# Patient Record
Sex: Female | Born: 1960
Health system: Southern US, Community
[De-identification: ages and names within clinical notes are randomized; demographics above are authoritative.]

## PROBLEM LIST (undated history)

## (undated) DIAGNOSIS — Z9889 Other specified postprocedural states: Secondary | ICD-10-CM

## (undated) DIAGNOSIS — R112 Nausea with vomiting, unspecified: Secondary | ICD-10-CM

## (undated) DIAGNOSIS — T8859XA Other complications of anesthesia, initial encounter: Secondary | ICD-10-CM

## (undated) DIAGNOSIS — I10 Essential (primary) hypertension: Secondary | ICD-10-CM

## (undated) DIAGNOSIS — T4145XA Adverse effect of unspecified anesthetic, initial encounter: Secondary | ICD-10-CM

## (undated) DIAGNOSIS — T7840XA Allergy, unspecified, initial encounter: Secondary | ICD-10-CM

## (undated) HISTORY — DX: Allergy, unspecified, initial encounter: T78.40XA

## (undated) HISTORY — DX: Essential (primary) hypertension: I10

---
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
## 1983-01-03 HISTORY — PX: BACK SURGERY: SHX140

## 2002-10-08 ENCOUNTER — Encounter: Payer: Self-pay | Admitting: Obstetrics and Gynecology

## 2002-10-08 ENCOUNTER — Ambulatory Visit (HOSPITAL_COMMUNITY): Admission: RE | Admit: 2002-10-08 | Discharge: 2002-10-08 | Payer: Self-pay | Admitting: Obstetrics and Gynecology

## 2002-10-13 ENCOUNTER — Encounter: Payer: Self-pay | Admitting: Obstetrics and Gynecology

## 2002-10-13 ENCOUNTER — Ambulatory Visit (HOSPITAL_COMMUNITY): Admission: RE | Admit: 2002-10-13 | Discharge: 2002-10-13 | Payer: Self-pay | Admitting: Obstetrics and Gynecology

## 2006-07-16 ENCOUNTER — Ambulatory Visit (HOSPITAL_COMMUNITY): Admission: RE | Admit: 2006-07-16 | Discharge: 2006-07-16 | Payer: Self-pay | Admitting: Family Medicine

## 2006-10-01 ENCOUNTER — Ambulatory Visit (HOSPITAL_COMMUNITY): Admission: RE | Admit: 2006-10-01 | Discharge: 2006-10-01 | Payer: Self-pay | Admitting: Obstetrics & Gynecology

## 2007-10-02 ENCOUNTER — Ambulatory Visit (HOSPITAL_COMMUNITY): Admission: RE | Admit: 2007-10-02 | Discharge: 2007-10-02 | Payer: Self-pay | Admitting: Obstetrics & Gynecology

## 2008-10-07 ENCOUNTER — Ambulatory Visit (HOSPITAL_COMMUNITY): Admission: RE | Admit: 2008-10-07 | Discharge: 2008-10-07 | Payer: Self-pay | Admitting: Obstetrics & Gynecology

## 2009-10-25 ENCOUNTER — Ambulatory Visit (HOSPITAL_COMMUNITY)
Admission: RE | Admit: 2009-10-25 | Discharge: 2009-10-25 | Payer: Self-pay | Source: Home / Self Care | Admitting: Obstetrics & Gynecology

## 2010-03-03 ENCOUNTER — Ambulatory Visit (HOSPITAL_COMMUNITY)
Admission: RE | Admit: 2010-03-03 | Discharge: 2010-03-03 | Disposition: A | Discharge status: Home / Self Care | Payer: BC Managed Care – PPO | Source: Ambulatory Visit | Attending: Internal Medicine | Admitting: Internal Medicine

## 2010-03-03 ENCOUNTER — Other Ambulatory Visit (HOSPITAL_COMMUNITY): Payer: Self-pay | Admitting: Internal Medicine

## 2010-03-03 DIAGNOSIS — R05 Cough: Secondary | ICD-10-CM

## 2010-03-03 DIAGNOSIS — R059 Cough, unspecified: Secondary | ICD-10-CM | POA: Insufficient documentation

## 2011-04-10 ENCOUNTER — Other Ambulatory Visit (HOSPITAL_COMMUNITY): Payer: Self-pay | Admitting: Family Medicine

## 2011-04-10 DIAGNOSIS — Z139 Encounter for screening, unspecified: Secondary | ICD-10-CM

## 2011-04-17 ENCOUNTER — Ambulatory Visit (HOSPITAL_COMMUNITY)
Admission: RE | Admit: 2011-04-17 | Discharge: 2011-04-17 | Disposition: A | Discharge status: Home / Self Care | Payer: BC Managed Care – PPO | Source: Ambulatory Visit | Attending: Family Medicine | Admitting: Family Medicine

## 2011-04-17 DIAGNOSIS — Z1231 Encounter for screening mammogram for malignant neoplasm of breast: Secondary | ICD-10-CM | POA: Insufficient documentation

## 2011-04-17 DIAGNOSIS — Z139 Encounter for screening, unspecified: Secondary | ICD-10-CM

## 2011-05-16 ENCOUNTER — Encounter: Payer: Self-pay | Admitting: Internal Medicine

## 2011-07-03 ENCOUNTER — Encounter: Payer: Self-pay | Admitting: Internal Medicine

## 2011-07-03 ENCOUNTER — Ambulatory Visit (AMBULATORY_SURGERY_CENTER): Payer: BC Managed Care – PPO | Admitting: *Deleted

## 2011-07-03 VITALS — Ht 64.0 in | Wt 212.0 lb

## 2011-07-03 DIAGNOSIS — Z1211 Encounter for screening for malignant neoplasm of colon: Secondary | ICD-10-CM

## 2011-07-03 MED ORDER — MOVIPREP 100 G PO SOLR: ORAL | Status: DC

## 2011-07-12 ENCOUNTER — Ambulatory Visit (AMBULATORY_SURGERY_CENTER): Payer: BC Managed Care – PPO | Admitting: Internal Medicine

## 2011-07-12 ENCOUNTER — Encounter: Payer: Self-pay | Admitting: Internal Medicine

## 2011-07-12 VITALS — BP 112/65 | HR 82 | Temp 98.1°F | Resp 20 | Ht 64.0 in | Wt 212.0 lb

## 2011-07-12 DIAGNOSIS — Z1211 Encounter for screening for malignant neoplasm of colon: Secondary | ICD-10-CM

## 2011-07-12 MED ORDER — SODIUM CHLORIDE 0.9 % IV SOLN: 500.0000 mL | INTRAVENOUS | Status: DC

## 2011-07-12 NOTE — Patient Instructions (Signed)
YOU HAD AN ENDOSCOPIC PROCEDURE TODAY AT THE [facility]: Refer to the procedure report that was given to you for any specific questions about what was found during the examination.  If the procedure report does not answer your questions, please call your gastroenterologist to clarify.  If you requested that your care partner not be given the details of your procedure findings, then the procedure report has been included in a sealed envelope for you to review at your convenience later.  YOU SHOULD EXPECT: Some feelings of bloating in the abdomen. Passage of more gas than usual.  Walking can help get rid of the air that was put into your GI tract during the procedure and reduce the bloating. If you had a lower endoscopy (such as a colonoscopy or flexible sigmoidoscopy) you may notice spotting of blood in your stool or on the toilet paper. If you underwent a bowel prep for your procedure, then you may not have a normal bowel movement for a few days.  DIET: Your first meal following the procedure should be a light meal and then it is ok to progress to your normal diet.  A half-sandwich or bowl of soup is an example of a good first meal.  Heavy or fried foods are harder to digest and may make you feel nauseous or bloated.  Likewise meals heavy in dairy and vegetables can cause extra gas to form and this can also increase the bloating.  Drink plenty of fluids but you should avoid alcoholic beverages for 24 hours.  ACTIVITY: Your care partner should take you home directly after the procedure.  You should plan to take it easy, moving slowly for the rest of the day.  You can resume normal activity the day after the procedure however you should NOT DRIVE or use heavy machinery for 24 hours (because of the sedation medicines used during the test).    SYMPTOMS TO REPORT IMMEDIATELY: A gastroenterologist can be reached at any hour.  During normal business hours, 8:30 AM to 5:00 PM Monday through Friday,  call [phone].  After hours and on weekends, please call the GI answering service at [phone] who will take a message and have the physician on call contact you.   Following lower endoscopy (colonoscopy or flexible sigmoidoscopy):  Excessive amounts of blood in the stool  Significant tenderness or worsening of abdominal pains  Swelling of the abdomen that is new, acute  Fever of 100F or higher    FOLLOW UP: If any biopsies were taken you will be contacted by phone or by letter within the next 1-3 weeks.  Call your gastroenterologist if you have not heard about the biopsies in 3 weeks.  Our staff will call the home number listed on your records the next business day following your procedure to check on you and address any questions or concerns that you may have at that time regarding the information given to you following your procedure. This is a courtesy call and so if there is no answer at the home number and we have not heard from you through the emergency physician on call, we will assume that you have returned to your regular daily activities without incident.  SIGNATURES/CONFIDENTIALITY: You and/or your care partner have signed paperwork which will be entered into your electronic medical record.  These signatures attest to the fact that that the information above on your After Visit Summary has been reviewed and is understood.  Full responsibility of the confidentiality   of this discharge information lies with you and/or your care-partner.     Your colonoscopy was normal today . Repeat colonoscopy in 10 years.

## 2011-07-12 NOTE — Progress Notes (Signed)
Patient did not have preoperative order for IV antibiotic SSI prophylaxis. (G8918)  Patient did not experience any of the following events: a burn prior to discharge; a fall within the facility; wrong site/side/patient/procedure/implant event; or a hospital transfer or hospital admission upon discharge from the facility. (G8907)  

## 2011-07-12 NOTE — Op Note (Signed)
[facility] [address] [place], [place]  [address]  COLONOSCOPY PROCEDURE REPORT  PATIENT:  Tracy Castro, Tracy Castro  MR#:  [id number] BIRTHDATE:  [birth year], 50 yrs. old  GENDER:  female ENDOSCOPIST:  Hedwig Morton. Juanda Chance, MD REF. BY:  Genia Del, M.D. Assunta Found, M.D. PROCEDURE DATE:  07/12/2011 PROCEDURE:  Colonoscopy 19147 ASA CLASS:  Class I INDICATIONS:  colorectal cancer screening, average risk MEDICATIONS:   MAC sedation, administered by CRNA, propofol (Diprivan) 250 mg  DESCRIPTION OF PROCEDURE:   After the risks and benefits and of the procedure were explained, informed consent was obtained. Digital rectal exam was performed and revealed no rectal masses. The LB [id number] endoscope was introduced through the anus and advanced to the cecum, which was identified by both the appendix and ileocecal valve.  The quality of the prep was good, using MoviPrep.  The instrument was then slowly withdrawn as the colon was fully examined. <<PROCEDUREIMAGES>>  FINDINGS:  No polyps or cancers were seen (see image1, image2, image3, image4, and image5).   Retroflexed views in the rectum revealed no abnormalities.    The scope was then withdrawn from the patient and the procedure completed.  COMPLICATIONS:  None ENDOSCOPIC IMPRESSION: 1) No polyps or cancers 2) Normal colonoscopy RECOMMENDATIONS: 1) High fiber diet.  REPEAT EXAM:  In 10 year(s) for.  ______________________________ Hedwig Morton. Juanda Chance, MD  CC:  n. eSIGNED:   Hedwig Morton. Jamerion Cabello at 07/12/2011 01:46 PM  Louie Bun, [phone]

## 2011-07-13 ENCOUNTER — Telehealth: Payer: Self-pay | Admitting: *Deleted

## 2011-07-13 NOTE — Telephone Encounter (Signed)
  Follow up Call-  Call back number 07/12/2011  Post procedure Call Back phone  # [phone]  Permission to leave phone message Yes     Patient questions:  Message left to call if necessary.

## 2012-10-23 ENCOUNTER — Other Ambulatory Visit (HOSPITAL_COMMUNITY): Payer: Self-pay | Admitting: Family Medicine

## 2012-10-23 DIAGNOSIS — Z139 Encounter for screening, unspecified: Secondary | ICD-10-CM

## 2012-11-12 ENCOUNTER — Ambulatory Visit (HOSPITAL_COMMUNITY)
Admission: RE | Admit: 2012-11-12 | Discharge: 2012-11-12 | Disposition: A | Discharge status: Home / Self Care | Payer: BC Managed Care – PPO | Source: Ambulatory Visit | Attending: Family Medicine | Admitting: Family Medicine

## 2012-11-12 DIAGNOSIS — Z139 Encounter for screening, unspecified: Secondary | ICD-10-CM

## 2012-11-12 DIAGNOSIS — Z1231 Encounter for screening mammogram for malignant neoplasm of breast: Secondary | ICD-10-CM | POA: Insufficient documentation

## 2013-02-19 ENCOUNTER — Other Ambulatory Visit (HOSPITAL_COMMUNITY): Payer: Self-pay | Admitting: Family Medicine

## 2013-02-19 ENCOUNTER — Ambulatory Visit (HOSPITAL_COMMUNITY)
Admission: RE | Admit: 2013-02-19 | Discharge: 2013-02-19 | Disposition: A | Discharge status: Home / Self Care | Payer: BC Managed Care – PPO | Source: Ambulatory Visit | Attending: Family Medicine | Admitting: Family Medicine

## 2013-02-19 DIAGNOSIS — M25519 Pain in unspecified shoulder: Secondary | ICD-10-CM

## 2013-04-25 ENCOUNTER — Emergency Department (HOSPITAL_COMMUNITY)
Admission: EM | Admit: 2013-04-25 | Discharge: 2013-04-25 | Disposition: A | Discharge status: Home / Self Care | Payer: BC Managed Care – PPO | Attending: Emergency Medicine | Admitting: Emergency Medicine

## 2013-04-25 ENCOUNTER — Encounter (HOSPITAL_COMMUNITY): Payer: Self-pay | Admitting: Emergency Medicine

## 2013-04-25 ENCOUNTER — Emergency Department (HOSPITAL_COMMUNITY): Payer: BC Managed Care – PPO

## 2013-04-25 DIAGNOSIS — I10 Essential (primary) hypertension: Secondary | ICD-10-CM | POA: Insufficient documentation

## 2013-04-25 DIAGNOSIS — X58XXXA Exposure to other specified factors, initial encounter: Secondary | ICD-10-CM

## 2013-04-25 DIAGNOSIS — M549 Dorsalgia, unspecified: Secondary | ICD-10-CM | POA: Insufficient documentation

## 2013-04-25 DIAGNOSIS — Z79899 Other long term (current) drug therapy: Secondary | ICD-10-CM | POA: Insufficient documentation

## 2013-04-25 DIAGNOSIS — Z9889 Other specified postprocedural states: Secondary | ICD-10-CM | POA: Insufficient documentation

## 2013-04-25 DIAGNOSIS — M84369A Stress fracture, unspecified tibia and fibula, initial encounter for fracture: Secondary | ICD-10-CM | POA: Insufficient documentation

## 2013-04-25 DIAGNOSIS — Y929 Unspecified place or not applicable: Secondary | ICD-10-CM | POA: Insufficient documentation

## 2013-04-25 DIAGNOSIS — Y9389 Activity, other specified: Secondary | ICD-10-CM | POA: Insufficient documentation

## 2013-04-25 DIAGNOSIS — IMO0002 Reserved for concepts with insufficient information to code with codable children: Secondary | ICD-10-CM | POA: Insufficient documentation

## 2013-04-25 DIAGNOSIS — S86899A Other injury of other muscle(s) and tendon(s) at lower leg level, unspecified leg, initial encounter: Secondary | ICD-10-CM

## 2013-04-25 DIAGNOSIS — X503XXA Overexertion from repetitive movements, initial encounter: Secondary | ICD-10-CM | POA: Insufficient documentation

## 2013-04-25 MED ORDER — KETOROLAC TROMETHAMINE 10 MG PO TABS
10.0000 mg | ORAL_TABLET | Freq: Once | ORAL | Status: AC
  Administered 2013-04-25: 10 mg via ORAL
  Filled 2013-04-25: qty 1

## 2013-04-25 MED ORDER — HYDROCODONE-ACETAMINOPHEN 5-325 MG PO TABS
2.0000 | ORAL_TABLET | Freq: Once | ORAL | Status: AC
  Administered 2013-04-25: 2 via ORAL
  Filled 2013-04-25: qty 2

## 2013-04-25 MED ORDER — HYDROCODONE-ACETAMINOPHEN 5-325 MG PO TABS: 1.0000 | ORAL_TABLET | ORAL | Status: DC | PRN

## 2013-04-25 NOTE — ED Notes (Addendum)
R shin and ankle pain began last week. States pain runs down front of shin from below knee to anterior ankle.  She had been running, cycling and walking.  Seen at urgent care Wednesday. Film done of R tibia/fibula but not ankle.  Given anti inflammatory which has not helped.  Has also been icing it without relief.  Wrapping it gave a little relief.

## 2013-04-25 NOTE — Discharge Instructions (Signed)
Please rest and elevate your right lower extremity. Please apply ice for 15-20 minute intervals. Please continue your anti-inflammatory medication. May use Norco at bedtime or every 4 hours if needed for more severe pain. Please refrain from strenuous activity over the next week, and then gradually work her way back into your routine. Please see the orthopedic specialist listed above, or the orthopedic specialist of your choice if not improving. Shin Splints Shin splints is a painful condition that is felt on the shinbone or in the muscles on either side of the bone (front of your lower leg). Shin splints happen when physical activities, such as sports or other demanding exercise, leads to inflammation of the muscles, tendons, and the thin layer that covers the shinbone.  CAUSES   Overuse of muscles.  Repetitive activities.  Flat feet or rigid arches. Activities that could contribute to shin splints include:  A sudden increase in exercise time.  Starting a new, demanding activity.  Running up hills or long distances.  Playing sports with sudden starts and stops.  A poor warm up.  Old or worn-out shoes. SYMPTOMS   Pain on the front of the leg.  Pain while exercising or at rest. DIAGNOSIS  Your caregiver will diagnose shin splints from a history of your symptoms and a physical exam. You may be observed as you walk or run. X-ray exams or further testing may be needed to rule out other problems, such as a stress fracture, which also causes lower leg pain. TREATMENT  Your caregiver may decide on the treatment based on your age, history, health, and how bad the pain is. Most cases of shin splints can be managed by one or more of the following:  Resting.  Reducing the length and intensity of your exercise.  Stopping the activity that causes shin pain.  Taking medicines to control the inflammation.  Icing, massaging, stretching, and strengthening the affected area.  Getting shoes  with rigid heels, shock absorption, and a good arch support. HOME CARE INSTRUCTIONS   Resume activity steadily or as directed by your caregiver.  Restart your exercise sessions with non-weight-bearing exercises, such as cycling or swimming.  Stop running if the pain returns.  Warm up properly before exercising.  Run on a level and fairly firm surface.  Gradually change the intensity of an exercise.  Limit increases in running distance by no more than 5 to 10% weekly. This means if you are running 5 miles, you can only increase your run by 1/2 a mile at a time.  Change your athletic shoes every 6 months, or every 350 to 450 miles. SEEK MEDICAL CARE IF:   Symptoms continue or worsen even after treatment.  The location, intensity, or type of pain changes over time. SEEK IMMEDIATE MEDICAL CARE IF:   You have severe pain.  You have trouble walking. MAKE SURE YOU:  Understand these instructions.  Will watch your condition.  Will get help right away if you are not doing well or get worse. Document Released: 12/17/1999 Document Revised: 03/13/2011 Document Reviewed: 06/05/2010 [facility] Patient Information 2014 [place], [place].

## 2013-04-25 NOTE — ED Provider Notes (Signed)
Medical screening examination/treatment/procedure(s) were performed by non-physician practitioner and as supervising physician I was immediately available for consultation/collaboration.   EKG Interpretation None      Devoria AlbeIva Warner Laduca, MD, Armando GangFACEP   Ward GivensIva L Berlene Dixson, MD 04/25/13 2329

## 2013-04-25 NOTE — ED Provider Notes (Signed)
CSN: [id number]     Arrival date & time 04/25/13  2046 History   First MD Initiated Contact with Patient 04/25/13 2157     Chief Complaint  Patient presents with  . Ankle Pain     (Consider location/radiation/quality/duration/timing/severity/associated sxs/prior Treatment) HPI Comments: Patient is a 53 year old female who presents to the emergency department with a complaint of pain of her right lower leg. The patient states that she has been doing a lot of bicycling, walking, and exercising more than she ever has in the past. Today she was walking across the parking lot, when she had a severe pain in her anterior portion of her lower leg, and it has not gone away. She was seen at one of the urgent cares had x-rays of her tibial area and it was negative. The doctors and radiologist thought that she should have an x-ray of her ankle to the major there was no occult injury with referred pain, the patient presents now for evaluation of her ankle as well as her tibial area pain. She's not had any previous operations or procedures involving the tibia.  Patient is a 53 y.o. female presenting with ankle pain. The history is provided by the patient.  Ankle Pain Associated symptoms: back pain   Associated symptoms: no neck pain     Past Medical History  Diagnosis Date  . Hypertension   . Allergy     seasonal   Past Surgical History  Procedure Laterality Date  . Cesarean section  1995  . Back surgery  1985    lower lumbar   History reviewed. No pertinent family history. History  Substance Use Topics  . Smoking status: Never Smoker   . Smokeless tobacco: Never Used  . Alcohol Use: No   OB History   Grav Para Term Preterm Abortions TAB SAB Ect Mult Living                 Review of Systems  Constitutional: Negative for activity change.       All ROS Neg except as noted in HPI  HENT: Negative for nosebleeds.   Eyes: Negative for photophobia and discharge.  Respiratory: Negative for  cough, shortness of breath and wheezing.   Cardiovascular: Negative for chest pain and palpitations.  Gastrointestinal: Negative for abdominal pain and blood in stool.  Genitourinary: Negative for dysuria, frequency and hematuria.  Musculoskeletal: Positive for back pain. Negative for arthralgias and neck pain.  Skin: Negative.   Neurological: Negative for dizziness, seizures and speech difficulty.  Psychiatric/Behavioral: Negative for hallucinations and confusion.      Allergies  Review of patient's allergies indicates no known allergies.  Home Medications   Prior to Admission medications   Medication Sig Start Date End Date Taking? Authorizing Provider  HYDROcodone-acetaminophen (NORCO/VICODIN) 5-325 MG per tablet Take 1 tablet by mouth every 4 (four) hours as needed for moderate pain. 04/25/13   Kathie DikeHobson M Lala Been, PA-C  lisinopril-hydrochlorothiazide (PRINZIDE,ZESTORETIC) 20-12.5 MG per tablet Take 1 tablet by mouth Daily. 06/15/11   Historical Provider, MD   BP 131/72  Pulse 93  Temp(Src) 97.9 F (36.6 C) (Oral)  Resp 22  Ht 5\' 4"  (1.626 m)  Wt 195 lb (88.451 kg)  BMI 33.46 kg/m2  SpO2 98% Physical Exam  Nursing note and vitals reviewed. Constitutional: She is oriented to person, place, and time. She appears well-developed and well-nourished.  Non-toxic appearance.  HENT:  Head: Normocephalic.  Right Ear: Tympanic membrane and external ear normal.  Left Ear:  Tympanic membrane and external ear normal.  Eyes: EOM and lids are normal. Pupils are equal, round, and reactive to light.  Neck: Normal range of motion. Neck supple. Carotid bruit is not present.  Cardiovascular: Normal rate, regular rhythm, normal heart sounds, intact distal pulses and normal pulses.   Pulmonary/Chest: Breath sounds normal. No respiratory distress.  Abdominal: Soft. Bowel sounds are normal. There is no tenderness. There is no guarding.  Musculoskeletal: Normal range of motion.       Legs: There is  slight increased redness of the lower tibial area. There is no hot areas appreciated. There is a negative Homans sign. Dorsalis pedis and posterior tibial pulses are 2+.   Lymphadenopathy:       Head (right side): No submandibular adenopathy present.       Head (left side): No submandibular adenopathy present.    She has no cervical adenopathy.  Neurological: She is alert and oriented to person, place, and time. She has normal strength. No cranial nerve deficit or sensory deficit.  Skin: Skin is warm and dry.  Psychiatric: She has a normal mood and affect. Her speech is normal.    ED Course  Procedures (including critical care time) Labs Review Labs Reviewed - No data to display  Imaging Review Dg Ankle Complete Right  04/25/2013   CLINICAL DATA:  Right ankle pain.  EXAM: RIGHT ANKLE - COMPLETE 3+ VIEW  COMPARISON:  None.  FINDINGS: Imaged bones, joints and soft tissues appear normal.  IMPRESSION: Negative exam.   Electronically Signed   By: Drusilla Kannerhomas  Dalessio M.D.   On: 04/25/2013 21:18     EKG Interpretation None      MDM X-ray of the right ankle is negative for fracture or dislocation or effusion of the joint. The examination is consistent with tibial stress syndrome, or shin splints. The plan at this time is for the patient to rest her legs over the next 48-72 hours. To continue to use her prescribed anti-inflammatory medication. Will add Norco at bedtime, or every 4 hours if needed for pain. The patient is to see Dr. Hilda LiasKeeling for additional evaluation if not improving.    Final diagnoses:  Medial tibial stress syndrome    **I have reviewed nursing notes, vital signs, and all appropriate lab and imaging results for this patient.Kathie Dike*    Alazar Cherian M Fabrizzio Marcella, PA-C 04/25/13 2321

## 2015-08-27 ENCOUNTER — Emergency Department (HOSPITAL_COMMUNITY)
Admission: EM | Admit: 2015-08-27 | Discharge: 2015-08-27 | Disposition: A | Discharge status: Home / Self Care | Payer: Worker's Compensation | Attending: Emergency Medicine | Admitting: Emergency Medicine

## 2015-08-27 ENCOUNTER — Encounter (HOSPITAL_COMMUNITY): Payer: Self-pay

## 2015-08-27 ENCOUNTER — Emergency Department (HOSPITAL_COMMUNITY): Payer: Worker's Compensation

## 2015-08-27 DIAGNOSIS — Z79899 Other long term (current) drug therapy: Secondary | ICD-10-CM | POA: Insufficient documentation

## 2015-08-27 DIAGNOSIS — I10 Essential (primary) hypertension: Secondary | ICD-10-CM | POA: Diagnosis not present

## 2015-08-27 DIAGNOSIS — Y929 Unspecified place or not applicable: Secondary | ICD-10-CM | POA: Diagnosis not present

## 2015-08-27 DIAGNOSIS — Y999 Unspecified external cause status: Secondary | ICD-10-CM | POA: Insufficient documentation

## 2015-08-27 DIAGNOSIS — X501XXA Overexertion from prolonged static or awkward postures, initial encounter: Secondary | ICD-10-CM | POA: Diagnosis not present

## 2015-08-27 DIAGNOSIS — Y939 Activity, unspecified: Secondary | ICD-10-CM | POA: Insufficient documentation

## 2015-08-27 DIAGNOSIS — S99912A Unspecified injury of left ankle, initial encounter: Secondary | ICD-10-CM | POA: Diagnosis present

## 2015-08-27 DIAGNOSIS — S93402A Sprain of unspecified ligament of left ankle, initial encounter: Secondary | ICD-10-CM | POA: Diagnosis not present

## 2015-08-27 MED ORDER — IBUPROFEN 800 MG PO TABS
800.0000 mg | ORAL_TABLET | Freq: Once | ORAL | Status: AC
  Administered 2015-08-27: 800 mg via ORAL
  Filled 2015-08-27: qty 1

## 2015-08-27 NOTE — ED Provider Notes (Signed)
TIME SEEN: 1:20 AM  CHIEF COMPLAINT: Left ankle and foot pain  HPI: Pt is a 55 y.o. female with history of hypertension who reports that she twisted her left ankle tonight just prior to arrival. Did not fall to the ground or hit her head. Has had throbbing pain since and has pain with weightbearing. Took Tylenol prior to arrival with some improvement of pain. No other injury on exam. No numbness or focal weakness.  ROS: See HPI Constitutional: no fever  Eyes: no drainage  ENT: no runny nose   Cardiovascular:  no chest pain  Resp: no SOB  GI: no vomiting GU: no dysuria Integumentary: no rash  Allergy: no hives  Musculoskeletal: no leg swelling  Neurological: no slurred speech ROS otherwise negative  PAST MEDICAL HISTORY/PAST SURGICAL HISTORY:  Past Medical History:  Diagnosis Date  . Allergy    seasonal  . Hypertension     MEDICATIONS:  Prior to Admission medications   Medication Sig Start Date End Date Taking? Authorizing Provider  HYDROcodone-acetaminophen (NORCO/VICODIN) 5-325 MG per tablet Take 1 tablet by mouth every 4 (four) hours as needed for moderate pain. 04/25/13   Ivery Quale, PA-C  lisinopril-hydrochlorothiazide (PRINZIDE,ZESTORETIC) 20-12.5 MG per tablet Take 1 tablet by mouth Daily. 06/15/11   Historical Provider, MD    ALLERGIES:  No Known Allergies  SOCIAL HISTORY:  Social History  Substance Use Topics  . Smoking status: Never Smoker  . Smokeless tobacco: Never Used  . Alcohol use No    FAMILY HISTORY: No family history on file.  EXAM: BP 135/90 (BP Location: Left Arm)   Pulse 86   Temp 97.9 F (36.6 C) (Oral)   Resp 18   Ht 5\' 4"  (1.626 m)   Wt 197 lb (89.4 kg)   SpO2 96%   BMI 33.81 kg/m  CONSTITUTIONAL: Alert and oriented and responds appropriately to questions. Well-appearing; well-nourished HEAD: Normocephalic EYES: Conjunctivae clear, PERRL ENT: normal nose; no rhinorrhea; moist mucous membranes NECK: Supple, no meningismus, no LAD   CARD: RRR; S1 and S2 appreciated; no murmurs, no clicks, no rubs, no gallops RESP: Normal chest excursion without splinting or tachypnea; breath sounds clear and equal bilaterally; no wheezes, no rhonchi, no rales, no hypoxia or respiratory distress, speaking full sentences ABD/GI: Normal bowel sounds; non-distended; soft, non-tender, no rebound, no guarding, no peritoneal signs BACK:  The back appears normal and is non-tender to palpation, there is no CVA tenderness EXT: Tender to palpation of the left ankle and the sole of the left foot with some ecchymosis to the dorsal foot without bony deformity. 2+ DP pulses bilaterally. No ligamentous laxity on exam. No tenderness over the proximal fibular head. Normal ROM in all joints; otherwise extremity is are non-tender to palpation; no edema; normal capillary refill; no cyanosis, no calf tenderness or swelling    SKIN: Normal color for age and race; warm; no rash NEURO: Moves all extremities equally, sensation to light touch intact diffusely, cranial nerves II through XII intact PSYCH: The patient's mood and manner are appropriate. Grooming and personal hygiene are appropriate.  MEDICAL DECISION MAKING: Patient here after she twisted her left ankle. Complaining of foot and ankle pain. X-rays show no fracture or dislocation. We'll place her in an Ace wrap for comfort and provided crutches to use as needed for comfort. We'll give her Dr. Mort Sawyers follow-up information if symptoms do not improve next 1-2 weeks despite rest, elevation, ice, anti-inflammatories. Patient reports she feels comfortable alternating Tylenol and Motrin at  home and does not want anything stronger for pain. Discussed with her return precautions. Patient and husband at bedside are comfortable with this plan.   At this time, I do not feel there is any life-threatening condition present. I have reviewed and discussed all results (EKG, imaging, lab, urine as appropriate), exam findings  with patient/family. I have reviewed nursing notes and appropriate previous records.  I feel the patient is safe to be discharged home without further emergent workup and can continue workup as an outpatient as needed. Discussed usual and customary return precautions. Patient/family verbalize understanding and are comfortable with this plan.  Outpatient follow-up has been provided. All questions have been answered.        Layla MawKristen N Ward, DO 08/27/15 [phone]

## 2015-08-27 NOTE — Discharge Instructions (Signed)
You may alternate between Tylenol 1000 mg every 6 hours as needed for pain and ibuprofen 800 mg every 8 hours as needed for pain. °

## 2015-08-27 NOTE — ED Triage Notes (Signed)
Pt states she rolled her ankle while walking at approx 1930 last night.

## 2017-01-09 DIAGNOSIS — M25551 Pain in right hip: Secondary | ICD-10-CM | POA: Diagnosis not present

## 2017-07-10 DIAGNOSIS — M1612 Unilateral primary osteoarthritis, left hip: Secondary | ICD-10-CM | POA: Diagnosis not present

## 2017-07-12 DIAGNOSIS — M1612 Unilateral primary osteoarthritis, left hip: Secondary | ICD-10-CM | POA: Diagnosis not present

## 2017-10-01 DIAGNOSIS — M1612 Unilateral primary osteoarthritis, left hip: Secondary | ICD-10-CM | POA: Diagnosis not present

## 2017-10-09 DIAGNOSIS — M545 Low back pain: Secondary | ICD-10-CM | POA: Diagnosis not present

## 2017-10-09 DIAGNOSIS — M1612 Unilateral primary osteoarthritis, left hip: Secondary | ICD-10-CM | POA: Diagnosis not present

## 2017-10-09 DIAGNOSIS — M47896 Other spondylosis, lumbar region: Secondary | ICD-10-CM | POA: Diagnosis not present

## 2017-10-15 DIAGNOSIS — M47896 Other spondylosis, lumbar region: Secondary | ICD-10-CM | POA: Diagnosis not present

## 2017-10-15 DIAGNOSIS — M1612 Unilateral primary osteoarthritis, left hip: Secondary | ICD-10-CM | POA: Diagnosis not present

## 2017-10-15 DIAGNOSIS — M545 Low back pain: Secondary | ICD-10-CM | POA: Diagnosis not present

## 2017-10-17 DIAGNOSIS — M545 Low back pain: Secondary | ICD-10-CM | POA: Diagnosis not present

## 2017-10-17 DIAGNOSIS — M47896 Other spondylosis, lumbar region: Secondary | ICD-10-CM | POA: Diagnosis not present

## 2017-10-17 DIAGNOSIS — M1612 Unilateral primary osteoarthritis, left hip: Secondary | ICD-10-CM | POA: Diagnosis not present

## 2017-10-22 DIAGNOSIS — M47896 Other spondylosis, lumbar region: Secondary | ICD-10-CM | POA: Diagnosis not present

## 2017-10-22 DIAGNOSIS — M1612 Unilateral primary osteoarthritis, left hip: Secondary | ICD-10-CM | POA: Diagnosis not present

## 2017-10-22 DIAGNOSIS — M545 Low back pain: Secondary | ICD-10-CM | POA: Diagnosis not present

## 2017-10-24 DIAGNOSIS — M1612 Unilateral primary osteoarthritis, left hip: Secondary | ICD-10-CM | POA: Diagnosis not present

## 2017-10-24 DIAGNOSIS — M545 Low back pain: Secondary | ICD-10-CM | POA: Diagnosis not present

## 2017-10-24 DIAGNOSIS — M47896 Other spondylosis, lumbar region: Secondary | ICD-10-CM | POA: Diagnosis not present

## 2017-10-29 DIAGNOSIS — M545 Low back pain: Secondary | ICD-10-CM | POA: Diagnosis not present

## 2017-10-29 DIAGNOSIS — M47896 Other spondylosis, lumbar region: Secondary | ICD-10-CM | POA: Diagnosis not present

## 2017-10-29 DIAGNOSIS — M1612 Unilateral primary osteoarthritis, left hip: Secondary | ICD-10-CM | POA: Diagnosis not present

## 2017-11-01 DIAGNOSIS — M545 Low back pain: Secondary | ICD-10-CM | POA: Diagnosis not present

## 2017-11-01 DIAGNOSIS — M1612 Unilateral primary osteoarthritis, left hip: Secondary | ICD-10-CM | POA: Diagnosis not present

## 2017-11-01 DIAGNOSIS — M47896 Other spondylosis, lumbar region: Secondary | ICD-10-CM | POA: Diagnosis not present

## 2017-11-06 DIAGNOSIS — M1612 Unilateral primary osteoarthritis, left hip: Secondary | ICD-10-CM | POA: Diagnosis not present

## 2017-11-06 DIAGNOSIS — M47896 Other spondylosis, lumbar region: Secondary | ICD-10-CM | POA: Diagnosis not present

## 2017-11-06 DIAGNOSIS — M545 Low back pain: Secondary | ICD-10-CM | POA: Diagnosis not present

## 2017-11-08 DIAGNOSIS — M47896 Other spondylosis, lumbar region: Secondary | ICD-10-CM | POA: Diagnosis not present

## 2017-11-08 DIAGNOSIS — M1612 Unilateral primary osteoarthritis, left hip: Secondary | ICD-10-CM | POA: Diagnosis not present

## 2017-11-08 DIAGNOSIS — M545 Low back pain: Secondary | ICD-10-CM | POA: Diagnosis not present

## 2017-11-14 DIAGNOSIS — M545 Low back pain: Secondary | ICD-10-CM | POA: Diagnosis not present

## 2017-11-14 DIAGNOSIS — M47896 Other spondylosis, lumbar region: Secondary | ICD-10-CM | POA: Diagnosis not present

## 2017-11-14 DIAGNOSIS — M1612 Unilateral primary osteoarthritis, left hip: Secondary | ICD-10-CM | POA: Diagnosis not present

## 2017-11-15 DIAGNOSIS — M545 Low back pain: Secondary | ICD-10-CM | POA: Diagnosis not present

## 2017-11-15 DIAGNOSIS — M1612 Unilateral primary osteoarthritis, left hip: Secondary | ICD-10-CM | POA: Diagnosis not present

## 2017-11-15 DIAGNOSIS — M47896 Other spondylosis, lumbar region: Secondary | ICD-10-CM | POA: Diagnosis not present

## 2017-11-20 DIAGNOSIS — M545 Low back pain: Secondary | ICD-10-CM | POA: Diagnosis not present

## 2017-11-20 DIAGNOSIS — M47896 Other spondylosis, lumbar region: Secondary | ICD-10-CM | POA: Diagnosis not present

## 2017-11-20 DIAGNOSIS — M1612 Unilateral primary osteoarthritis, left hip: Secondary | ICD-10-CM | POA: Diagnosis not present

## 2017-11-21 DIAGNOSIS — M1612 Unilateral primary osteoarthritis, left hip: Secondary | ICD-10-CM | POA: Diagnosis not present

## 2017-11-21 DIAGNOSIS — M545 Low back pain: Secondary | ICD-10-CM | POA: Diagnosis not present

## 2017-11-21 DIAGNOSIS — M47896 Other spondylosis, lumbar region: Secondary | ICD-10-CM | POA: Diagnosis not present

## 2017-11-26 DIAGNOSIS — M47896 Other spondylosis, lumbar region: Secondary | ICD-10-CM | POA: Diagnosis not present

## 2017-11-26 DIAGNOSIS — M1612 Unilateral primary osteoarthritis, left hip: Secondary | ICD-10-CM | POA: Diagnosis not present

## 2017-11-26 DIAGNOSIS — M545 Low back pain: Secondary | ICD-10-CM | POA: Diagnosis not present

## 2017-11-27 DIAGNOSIS — M1612 Unilateral primary osteoarthritis, left hip: Secondary | ICD-10-CM | POA: Diagnosis not present

## 2017-11-27 DIAGNOSIS — M47896 Other spondylosis, lumbar region: Secondary | ICD-10-CM | POA: Diagnosis not present

## 2017-11-27 DIAGNOSIS — M545 Low back pain: Secondary | ICD-10-CM | POA: Diagnosis not present

## 2017-12-03 DIAGNOSIS — M545 Low back pain: Secondary | ICD-10-CM | POA: Diagnosis not present

## 2017-12-03 DIAGNOSIS — M47896 Other spondylosis, lumbar region: Secondary | ICD-10-CM | POA: Diagnosis not present

## 2017-12-03 DIAGNOSIS — M1612 Unilateral primary osteoarthritis, left hip: Secondary | ICD-10-CM | POA: Diagnosis not present

## 2017-12-04 DIAGNOSIS — M1612 Unilateral primary osteoarthritis, left hip: Secondary | ICD-10-CM | POA: Diagnosis not present

## 2017-12-04 DIAGNOSIS — M47896 Other spondylosis, lumbar region: Secondary | ICD-10-CM | POA: Diagnosis not present

## 2017-12-04 DIAGNOSIS — M545 Low back pain: Secondary | ICD-10-CM | POA: Diagnosis not present

## 2017-12-10 DIAGNOSIS — M1612 Unilateral primary osteoarthritis, left hip: Secondary | ICD-10-CM | POA: Diagnosis not present

## 2017-12-10 DIAGNOSIS — M545 Low back pain: Secondary | ICD-10-CM | POA: Diagnosis not present

## 2017-12-10 DIAGNOSIS — M47896 Other spondylosis, lumbar region: Secondary | ICD-10-CM | POA: Diagnosis not present

## 2017-12-18 DIAGNOSIS — M47896 Other spondylosis, lumbar region: Secondary | ICD-10-CM | POA: Diagnosis not present

## 2017-12-18 DIAGNOSIS — M1612 Unilateral primary osteoarthritis, left hip: Secondary | ICD-10-CM | POA: Diagnosis not present

## 2017-12-18 DIAGNOSIS — M545 Low back pain: Secondary | ICD-10-CM | POA: Diagnosis not present

## 2018-02-21 DIAGNOSIS — R490 Dysphonia: Secondary | ICD-10-CM | POA: Diagnosis not present

## 2018-02-21 DIAGNOSIS — Z6835 Body mass index (BMI) 35.0-35.9, adult: Secondary | ICD-10-CM | POA: Diagnosis not present

## 2018-02-21 DIAGNOSIS — E669 Obesity, unspecified: Secondary | ICD-10-CM | POA: Diagnosis not present

## 2018-04-08 DIAGNOSIS — M1612 Unilateral primary osteoarthritis, left hip: Secondary | ICD-10-CM | POA: Diagnosis not present

## 2018-05-15 DIAGNOSIS — M1612 Unilateral primary osteoarthritis, left hip: Secondary | ICD-10-CM | POA: Diagnosis not present

## 2018-05-21 DIAGNOSIS — M1612 Unilateral primary osteoarthritis, left hip: Secondary | ICD-10-CM | POA: Diagnosis present

## 2018-05-21 DIAGNOSIS — I10 Essential (primary) hypertension: Secondary | ICD-10-CM | POA: Diagnosis present

## 2018-05-21 NOTE — H&P (Addendum)
HIP ARTHROPLASTY ADMISSION H&P  Patient ID: Tracy Castro MRN: [id number] DOB/AGE: 58/04/1960 58 y.o.  Chief Complaint: left hip pain.  Planned Procedure Date: 06/04/2018 Medical and cardiac clearance by Dr. Phillips OdorGolding   HPI: Tracy Castro is a 58 y.o. female who presents for evaluation of OA LEFT HIP. The patient has a history of pain and functional disability in the left hip due to arthritis and has failed non-surgical conservative treatments for greater than 12 weeks to include NSAID's and/or analgesics, corticosteriod injections and activity modification.  Onset of symptoms was gradual, starting 2 years ago with gradually worsening course since that time.  Patient currently rates pain at 8 out of 10 with activity. Patient has night pain, worsening of pain with activity and weight bearing and pain that interferes with activities of daily living.  Patient has evidence of subchondral cysts, subchondral sclerosis, periarticular osteophytes and joint space narrowing by imaging studies.  There is no active infection.  Past Medical History:  Diagnosis Date  . Allergy    seasonal  . Hypertension    Past Surgical History:  Procedure Laterality Date  . BACK SURGERY  1985   lower lumbar  . CESAREAN SECTION  1995   No Known Allergies   Prior to Admission medications   Medication Sig Start Date End Date Taking? Authorizing Provider  cetirizine (ZYRTEC) 10 MG tablet Take 10 mg by mouth daily.   Yes [provider]   Social history: Non smoker.  No alcohol use.  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  Family history: Father w/ HTN.  Grandparents w/ MI, HTN.  ROS: Currently denies lightheadedness, dizziness, Fever, chills, CP, SOB.   No personal history of DVT, PE, MI, or CVA. No loose teeth or dentures. All other systems have been reviewed and were otherwise currently negative with the exception of those mentioned in the HPI and as above.  Objective: Vitals: Ht: 5'4"  Wt: 205 Temp: 97.9 BP: 136/89  Pulse: 92 O2 97% on room air.   Physical Exam: General: Alert, NAD. Trendelenberg Gait  HEENT: EOMI, Good Neck Extension  Pulm: No increased work of breathing.  Clear B/L A/P w/o crackle or wheeze.  CV: RRR, No m/g/r appreciated  GI: soft, NT, ND Neuro: Neuro without gross focal deficit.  Sensation intact distally Skin: No lesions in the area of chief complaint MSK/Surgical Site: Left hip pain with passive ROM.  Positive Stinchfield.  5/5 strength.  NVI.  Sensation intact distally.  Imaging Review Plain radiographs demonstrate severe degenerative joint disease of the left hip.   Preoperative templating of the joint replacement has been completed, documented, and submitted to the Operating Room personnel in order to optimize intra-operative equipment management.  Assessment: OA LEFT HIP Principal Problem:   Primary osteoarthritis of left hip Active Problems:   Hypertension   Plan: Plan for Procedure(s): TOTAL HIP ARTHROPLASTY ANTERIOR APPROACH  The patient history, physical exam, clinical judgement of the provider and imaging are consistent with end stage degenerative joint disease and total joint arthroplasty is deemed medically necessary. The treatment options including medical management, injection therapy, and arthroplasty were discussed at length. The risks and benefits of Procedure(s): TOTAL HIP ARTHROPLASTY ANTERIOR APPROACH were presented and reviewed.  The risks of nonoperative treatment, versus surgical intervention including but not limited to continued pain, aseptic loosening, stiffness, dislocation/subluxation, infection, bleeding, nerve injury, blood clots, cardiopulmonary complications, morbidity, mortality, among others were discussed. The patient verbalizes understanding and wishes to proceed with the plan.  Patient is being admitted for  inpatient treatment for surgery, pain control, PT, OT, prophylactic antibiotics, VTE prophylaxis, progressive ambulation, ADL's and  discharge planning.   Dental prophylaxis discussed and recommended for 2 years postoperatively.   The patient does meet the criteria for TXA which will be used perioperatively.    ASA 81 mg BID will be used postoperatively for DVT prophylaxis in addition to SCDs, and early ambulation.  Plan for Celebrex, Gabapentin, Norco for pain.  Robaxin for spasm.  Omeprazole for gastric protection.  The patient is planning to be discharged home with outpatient physical therapy in care of her husband.   Patient's anticipated LOS is less than 2 midnights, meeting these requirements: - Younger than 10 - Lives within 1 hour of care - Has a competent adult at home to recover with post-op recover - NO history of  - Chronic pain requiring opiods  - Diabetes  - Coronary Artery Disease  - Heart failure  - Heart attack  - Stroke  - DVT/VTE  - Cardiac arrhythmia  - Respiratory Failure/COPD  - Renal failure  - Anemia  - Advanced Liver disease  Albina Billet III, PA-C 05/21/2018 3:25 PM

## 2018-05-22 NOTE — Progress Notes (Signed)
SPOKE W/Pt _     SCREENING SYMPTOMS OF COVID 19:   COUGH--No  RUNNY NOSE--- No  SORE THROAT---No  NASAL CONGESTION----No  SNEEZING----No  SHORTNESS OF BREATH---No  DIFFICULTY BREATHING---No  TEMP >100.0 -----No  UNEXPLAINED BODY ACHES------No  CHILLS --------No   HEADACHES ---------No  LOSS OF SMELL/ TASTE --------No    HAVE YOU OR ANY FAMILY MEMBER TRAVELLED PAST 14 DAYS OUT OF THE   COUNTY---No STATE----No COUNTRY---No-  HAVE YOU OR ANY FAMILY MEMBER BEEN EXPOSED TO ANYONE WITH COVID 19? No    

## 2018-05-22 NOTE — Patient Instructions (Signed)
REIZEL CALZADA  05/22/2018   Your procedure is scheduled on: 06-04-18     Report to [facility] Main  Entrance    Report to Admitting at 10:00 AM   YOU NEED TO HAVE A COVID 19 TEST ON 05-31-18, THIS TEST MUST BE DONE BEFORE SURGERY, COME TO [facility] ENTRANCE BETWEEN THE HOURS OF 900 AM AND 300 PM ON YOUR COVID TEST DATE.   Call this number if you have problems the morning of surgery [phone]    Remember: NO SOLID FOOD AFTER MIDNIGHT THE NIGHT PRIOR TO SURGERY. NOTHING BY MOUTH EXCEPT CLEAR LIQUIDS UNTIL 3 HOURS PRIOR TO SCHEDULED SURGERY. PLEASE FINISH ENSURE DRINK PER SURGEON ORDER 3 HOURS PRIOR TO SCHEDULED SURGERY TIME WHICH NEEDS TO BE COMPLETED AT  4:30 AM.   CLEAR LIQUID DIET   Foods Allowed                                                                     Foods Excluded  Coffee and tea, regular and decaf                             liquids that you cannot  Plain Jell-O in any flavor                                             see through such as: Fruit ices (not with fruit pulp)                                     milk, soups, orange juice  Iced Popsicles                                    All solid food Carbonated beverages, regular and diet                                    Cranberry, grape and apple juices Sports drinks like Gatorade Lightly seasoned clear broth or consume(fat free) Sugar, honey syrup  Sample Menu Breakfast                                Lunch                                     Supper Cranberry juice                    Beef broth                            Chicken broth Jell-O  Grape juice                           Apple juice Coffee or tea                        Jell-O                                      Popsicle                                                Coffee or tea                        Coffee or  tea  _____________________________________________________________________              BRUSH YOUR TEETH MORNING OF SURGERY AND RINSE YOUR MOUTH OUT, NO CHEWING GUM CANDY OR MINTS.     Take these medicines the morning of surgery with A SIP OF WATER:Cetirizine (Zyrtec)                                 You may not have any metal on your body including hair pins and              piercings     Do not wear jewelry, make-up, lotions, powders or perfumes, deodorant               Do not wear nail polish.  Do not shave  48 hours prior to surgery.              Do not bring valuables to the hospital. [facility] IS NOT             RESPONSIBLE   FOR VALUABLES.  Contacts, dentures or bridgework may not be worn into surgery.  Leave suitcase in the car. After surgery it may be brought to your room.    Special Instructions: N/A              Please read over the following fact sheets you were given: _____________________________________________________________________             [facility] Pre[facility] Before surgery, you can play an important role.  Because skin is not sterile, your skin needs to be as free of germs as possible.  You can reduce the number of germs on your skin by washing with CHG (chlorahexidine gluconate) soap before surgery.  CHG is an antiseptic cleaner which kills germs and bonds with the skin to continue killing germs even after washing. Please DO NOT use if you have an allergy to CHG or antibacterial soaps.  If your skin becomes reddened/irritated stop using the CHG and inform your nurse when you arrive at Short Stay. Do not shave (including legs and underarms) for at least 48 hours prior to the first CHG shower.  You may shave your face/neck. Please follow these instructions carefully:  1.  Shower with CHG Soap the night before surgery and the  morning of Surgery.  2.  If you choose to wash your hair, wash your hair first as usual  with your  normal   shampoo.  3.  After you shampoo, rinse your hair and body thoroughly to remove the  shampoo.                           4.  Use CHG as you would any other liquid soap.  You can apply chg directly  to the skin and wash                       Gently with a scrungie or clean washcloth.  5.  Apply the CHG Soap to your body ONLY FROM THE NECK DOWN.   Do not use on face/ open                           Wound or open sores. Avoid contact with eyes, ears mouth and genitals (private parts).                       Wash face,  Genitals (private parts) with your normal soap.             6.  Wash thoroughly, paying special attention to the area where your surgery  will be performed.  7.  Thoroughly rinse your body with warm water from the neck down.  8.  DO NOT shower/wash with your normal soap after using and rinsing off  the CHG Soap.                9.  Pat yourself dry with a clean towel.            10.  Wear clean pajamas.            11.  Place clean sheets on your bed the night of your first shower and do not  sleep with pets. Day of Surgery : Do not apply any lotions/deodorants the morning of surgery.  Please wear clean clothes to the hospital/surgery center.  FAILURE TO FOLLOW THESE INSTRUCTIONS MAY RESULT IN THE CANCELLATION OF YOUR SURGERY PATIENT SIGNATURE_________________________________  NURSE SIGNATURE__________________________________  ________________________________________________________________________   Adam Phenix  An incentive spirometer is a tool that can help keep your lungs clear and active. This tool measures how well you are filling your lungs with each breath. Taking long deep breaths may help reverse or decrease the chance of developing breathing (pulmonary) problems (especially infection) following:  A long period of time when you are unable to move or be active. BEFORE THE PROCEDURE   If the spirometer includes an indicator to show your best effort, your nurse or  respiratory therapist will set it to a desired goal.  If possible, sit up straight or lean slightly forward. Try not to slouch.  Hold the incentive spirometer in an upright position. INSTRUCTIONS FOR USE  1. Sit on the edge of your bed if possible, or sit up as far as you can in bed or on a chair. 2. Hold the incentive spirometer in an upright position. 3. Breathe out normally. 4. Place the mouthpiece in your mouth and seal your lips tightly around it. 5. Breathe in slowly and as deeply as possible, raising the piston or the ball toward the top of the column. 6. Hold your breath for 3-5 seconds or for as long as possible. Allow the piston or ball to fall to the bottom of the column. 7. Remove the mouthpiece from  your mouth and breathe out normally. 8. Rest for a few seconds and repeat Steps 1 through 7 at least 10 times every 1-2 hours when you are awake. Take your time and take a few normal breaths between deep breaths. 9. The spirometer may include an indicator to show your best effort. Use the indicator as a goal to work toward during each repetition. 10. After each set of 10 deep breaths, practice coughing to be sure your lungs are clear. If you have an incision (the cut made at the time of surgery), support your incision when coughing by placing a pillow or rolled up towels firmly against it. Once you are able to get out of bed, walk around indoors and cough well. You may stop using the incentive spirometer when instructed by your caregiver.  RISKS AND COMPLICATIONS  Take your time so you do not get dizzy or light-headed.  If you are in pain, you may need to take or ask for pain medication before doing incentive spirometry. It is harder to take a deep breath if you are having pain. AFTER USE  Rest and breathe slowly and easily.  It can be helpful to keep track of a log of your progress. Your caregiver can provide you with a simple table to help with this. If you are using the  spirometer at home, follow these instructions: SEEK MEDICAL CARE IF:   You are having difficultly using the spirometer.  You have trouble using the spirometer as often as instructed.  Your pain medication is not giving enough relief while using the spirometer.  You develop fever of 100.5 F (38.1 C) or higher. SEEK IMMEDIATE MEDICAL CARE IF:   You cough up bloody sputum that had not been present before.  You develop fever of 102 F (38.9 C) or greater.  You develop worsening pain at or near the incision site. MAKE SURE YOU:   Understand these instructions.  Will watch your condition.  Will get help right away if you are not doing well or get worse. Document Released: 05/01/2006 Document Revised: 03/13/2011 Document Reviewed: 07/02/2006 [facility] Patient Information 2014 [place], [place].   ________________________________________________________________________

## 2018-05-24 ENCOUNTER — Encounter (HOSPITAL_COMMUNITY): Payer: Self-pay

## 2018-05-24 ENCOUNTER — Other Ambulatory Visit: Payer: Self-pay

## 2018-05-24 ENCOUNTER — Encounter (HOSPITAL_COMMUNITY)
Admission: RE | Admit: 2018-05-24 | Discharge: 2018-05-24 | Disposition: A | Discharge status: Home / Self Care | Payer: BC Managed Care – PPO | Source: Ambulatory Visit | Attending: Orthopedic Surgery | Admitting: Orthopedic Surgery

## 2018-05-24 DIAGNOSIS — Z01818 Encounter for other preprocedural examination: Secondary | ICD-10-CM | POA: Diagnosis present

## 2018-05-24 DIAGNOSIS — I10 Essential (primary) hypertension: Secondary | ICD-10-CM | POA: Insufficient documentation

## 2018-05-24 DIAGNOSIS — M1612 Unilateral primary osteoarthritis, left hip: Secondary | ICD-10-CM | POA: Insufficient documentation

## 2018-05-24 DIAGNOSIS — R9431 Abnormal electrocardiogram [ECG] [EKG]: Secondary | ICD-10-CM | POA: Insufficient documentation

## 2018-05-24 HISTORY — DX: Other specified postprocedural states: Z98.890

## 2018-05-24 HISTORY — DX: Other complications of anesthesia, initial encounter: T88.59XA

## 2018-05-24 HISTORY — DX: Other specified postprocedural states: R11.2

## 2018-05-24 LAB — BASIC METABOLIC PANEL
Anion gap: 8 (ref 5–15)
BUN: 17 mg/dL (ref 6–20)
CO2: 27 mmol/L (ref 22–32)
Calcium: 9.3 mg/dL (ref 8.9–10.3)
Chloride: 107 mmol/L (ref 98–111)
Creatinine, Ser: 0.67 mg/dL (ref 0.44–1.00)
GFR calc Af Amer: >60 mL/min (ref >60)
GFR calc non Af Amer: >60 mL/min (ref >60)
Glucose, Bld: 118 mg/dL — ABNORMAL HIGH (ref 70–99)
Potassium: 4.2 mmol/L (ref 3.5–5.1)
Sodium: 142 mmol/L (ref 135–145)

## 2018-05-24 LAB — URINALYSIS, ROUTINE W REFLEX MICROSCOPIC
Bilirubin Urine: NEGATIVE
Glucose, UA: NEGATIVE mg/dL
Ketones, ur: NEGATIVE mg/dL
Nitrite: NEGATIVE
Protein, ur: NEGATIVE mg/dL
Specific Gravity, Urine: 1.021 (ref 1.005–1.030)
WBC, UA: >50 WBC/hpf — ABNORMAL HIGH (ref 0–5)
pH: 6 (ref 5.0–8.0)

## 2018-05-24 LAB — CBC
HCT: 41.2 % (ref 36.0–46.0)
Hemoglobin: 13.7 g/dL (ref 12.0–15.0)
MCH: 31.8 pg (ref 26.0–34.0)
MCHC: 33.3 g/dL (ref 30.0–36.0)
MCV: 95.6 fL (ref 80.0–100.0)
Platelets: 367 10*3/uL (ref 150–400)
RBC: 4.31 MIL/uL (ref 3.87–5.11)
RDW: 12.3 % (ref 11.5–15.5)
WBC: 8.6 10*3/uL (ref 4.0–10.5)
nRBC: 0 % (ref 0.0–0.2)

## 2018-05-24 LAB — SURGICAL PCR SCREEN
MRSA, PCR: NEGATIVE
Staphylococcus aureus: NEGATIVE

## 2018-05-31 ENCOUNTER — Other Ambulatory Visit (HOSPITAL_COMMUNITY)
Admission: RE | Admit: 2018-05-31 | Discharge: 2018-05-31 | Disposition: A | Discharge status: Home / Self Care | Payer: BC Managed Care – PPO | Source: Ambulatory Visit | Attending: Orthopedic Surgery | Admitting: Orthopedic Surgery

## 2018-05-31 DIAGNOSIS — Z1159 Encounter for screening for other viral diseases: Secondary | ICD-10-CM | POA: Insufficient documentation

## 2018-06-01 LAB — NOVEL CORONAVIRUS, NAA (HOSP ORDER, SEND-OUT TO REF LAB; TAT 18-24 HRS): SARS-CoV-2, NAA: NOT DETECTED

## 2018-06-03 MED ORDER — BUPIVACAINE LIPOSOME 1.3 % IJ SUSP
10.0000 mL | Freq: Once | INTRAMUSCULAR | Status: DC
  Filled 2018-06-03 (×3): qty 10

## 2018-06-03 NOTE — Progress Notes (Signed)
Pt made aware that surgery time on 06-04-18 is now 10:00 AM. Pt to report to Admitting at 7:30 AM. Pt verbalized understanding.

## 2018-06-03 NOTE — Progress Notes (Signed)
SPOKE W/  Tracy Castro      SCREENING SYMPTOMS OF COVID 19:   COUGH--NO  RUNNY NOSE--- NO  SORE THROAT---NO  NASAL CONGESTION----NO  SNEEZING----NO  SHORTNESS OF BREATH---NO  DIFFICULTY BREATHING---NO  TEMP >100.0 -----NO  UNEXPLAINED BODY ACHES------NO  CHILLS --------NO  HEADACHES ---------NO  LOSS OF SMELL/ TASTE --------NO    HAVE YOU OR ANY FAMILY MEMBER TRAVELLED PAST 14 DAYS OUT OF THE   COUNTY---NO STATE----NO COUNTRY----NO  HAVE YOU OR ANY FAMILY MEMBER BEEN EXPOSED TO ANYONE WITH COVID 19? NO

## 2018-06-04 ENCOUNTER — Ambulatory Visit (HOSPITAL_COMMUNITY): Payer: BC Managed Care – PPO

## 2018-06-04 ENCOUNTER — Encounter (HOSPITAL_COMMUNITY): Payer: Self-pay | Admitting: *Deleted

## 2018-06-04 ENCOUNTER — Observation Stay (HOSPITAL_COMMUNITY)
Admission: RE | Admit: 2018-06-04 | Discharge: 2018-06-05 | Disposition: A | Discharge status: Home / Self Care | Payer: BC Managed Care – PPO | Attending: Orthopedic Surgery | Admitting: Orthopedic Surgery

## 2018-06-04 ENCOUNTER — Ambulatory Visit (HOSPITAL_COMMUNITY): Payer: BC Managed Care – PPO | Admitting: Physician Assistant

## 2018-06-04 ENCOUNTER — Other Ambulatory Visit: Payer: Self-pay

## 2018-06-04 ENCOUNTER — Ambulatory Visit (HOSPITAL_COMMUNITY): Payer: BC Managed Care – PPO | Admitting: Anesthesiology

## 2018-06-04 ENCOUNTER — Encounter (HOSPITAL_COMMUNITY)
Admission: RE | Disposition: A | Discharge status: Home / Self Care | Payer: Self-pay | Source: Home / Self Care | Attending: Orthopedic Surgery

## 2018-06-04 DIAGNOSIS — M1612 Unilateral primary osteoarthritis, left hip: Principal | ICD-10-CM | POA: Insufficient documentation

## 2018-06-04 DIAGNOSIS — Z79899 Other long term (current) drug therapy: Secondary | ICD-10-CM | POA: Diagnosis not present

## 2018-06-04 DIAGNOSIS — E669 Obesity, unspecified: Secondary | ICD-10-CM | POA: Insufficient documentation

## 2018-06-04 DIAGNOSIS — M1610 Unilateral primary osteoarthritis, unspecified hip: Secondary | ICD-10-CM | POA: Diagnosis present

## 2018-06-04 DIAGNOSIS — I10 Essential (primary) hypertension: Secondary | ICD-10-CM | POA: Insufficient documentation

## 2018-06-04 DIAGNOSIS — Z6834 Body mass index (BMI) 34.0-34.9, adult: Secondary | ICD-10-CM | POA: Insufficient documentation

## 2018-06-04 DIAGNOSIS — Z419 Encounter for procedure for purposes other than remedying health state, unspecified: Secondary | ICD-10-CM

## 2018-06-04 HISTORY — PX: TOTAL HIP ARTHROPLASTY: SHX124

## 2018-06-04 SURGERY — ARTHROPLASTY, HIP, TOTAL, ANTERIOR APPROACH
Anesthesia: Spinal | Site: Hip | Laterality: Left

## 2018-06-04 MED ORDER — OXYCODONE HCL 5 MG PO TABS: 5.0000 mg | ORAL_TABLET | Freq: Once | ORAL | Status: DC | PRN

## 2018-06-04 MED ORDER — CEFAZOLIN SODIUM-DEXTROSE 2-4 GM/100ML-% IV SOLN
INTRAVENOUS | Status: AC
  Filled 2018-06-04: qty 100

## 2018-06-04 MED ORDER — ACETAMINOPHEN 500 MG PO TABS
1000.0000 mg | ORAL_TABLET | Freq: Once | ORAL | Status: AC
  Administered 2018-06-04: 08:00:00 1000 mg via ORAL

## 2018-06-04 MED ORDER — CEFAZOLIN SODIUM-DEXTROSE 2-4 GM/100ML-% IV SOLN
2.0000 g | INTRAVENOUS | Status: AC
  Administered 2018-06-04: 2 g via INTRAVENOUS

## 2018-06-04 MED ORDER — HYDROCODONE-ACETAMINOPHEN 5-325 MG PO TABS: 1.0000 | ORAL_TABLET | Freq: Four times a day (QID) | ORAL | 0 refills | Status: AC | PRN

## 2018-06-04 MED ORDER — ACETAMINOPHEN 500 MG PO TABS
ORAL_TABLET | ORAL | Status: AC
  Administered 2018-06-04: 1000 mg via ORAL
  Filled 2018-06-04: qty 2

## 2018-06-04 MED ORDER — HYDROMORPHONE HCL 1 MG/ML IJ SOLN: 0.5000 mg | INTRAMUSCULAR | Status: DC | PRN

## 2018-06-04 MED ORDER — TRANEXAMIC ACID-NACL 1000-0.7 MG/100ML-% IV SOLN
1000.0000 mg | INTRAVENOUS | Status: AC
  Administered 2018-06-04: 1000 mg via INTRAVENOUS

## 2018-06-04 MED ORDER — SODIUM CHLORIDE FLUSH 0.9 % IV SOLN
INTRAVENOUS | Status: DC | PRN
  Administered 2018-06-04: 20 mL

## 2018-06-04 MED ORDER — METHOCARBAMOL 500 MG PO TABS
500.0000 mg | ORAL_TABLET | Freq: Four times a day (QID) | ORAL | Status: DC | PRN
  Administered 2018-06-04 – 2018-06-05 (×3): 500 mg via ORAL
  Filled 2018-06-04 (×3): qty 1

## 2018-06-04 MED ORDER — GABAPENTIN 300 MG PO CAPS
300.0000 mg | ORAL_CAPSULE | Freq: Three times a day (TID) | ORAL | Status: DC
  Administered 2018-06-04 – 2018-06-05 (×3): 300 mg via ORAL
  Filled 2018-06-04 (×3): qty 1

## 2018-06-04 MED ORDER — POVIDONE-IODINE 10 % EX SWAB
2.0000 "application " | Freq: Once | CUTANEOUS | Status: AC
  Administered 2018-06-04: 2 via TOPICAL

## 2018-06-04 MED ORDER — METHOCARBAMOL 500 MG PO TABS: 500.0000 mg | ORAL_TABLET | Freq: Three times a day (TID) | ORAL | 0 refills | Status: DC | PRN

## 2018-06-04 MED ORDER — METOCLOPRAMIDE HCL 5 MG PO TABS: 5.0000 mg | ORAL_TABLET | Freq: Three times a day (TID) | ORAL | Status: DC | PRN

## 2018-06-04 MED ORDER — SCOPOLAMINE 1 MG/3DAYS TD PT72
MEDICATED_PATCH | TRANSDERMAL | Status: AC
  Administered 2018-06-04: 1.5 mg
  Filled 2018-06-04: qty 1

## 2018-06-04 MED ORDER — METOCLOPRAMIDE HCL 5 MG/ML IJ SOLN: 5.0000 mg | Freq: Three times a day (TID) | INTRAMUSCULAR | Status: DC | PRN

## 2018-06-04 MED ORDER — PROPOFOL 10 MG/ML IV BOLUS
INTRAVENOUS | Status: AC
  Filled 2018-06-04: qty 20

## 2018-06-04 MED ORDER — CHLORHEXIDINE GLUCONATE 4 % EX LIQD: 60.0000 mL | Freq: Once | CUTANEOUS | Status: DC

## 2018-06-04 MED ORDER — OXYCODONE HCL 5 MG/5ML PO SOLN: 5.0000 mg | Freq: Once | ORAL | Status: DC | PRN

## 2018-06-04 MED ORDER — TRANEXAMIC ACID-NACL 1000-0.7 MG/100ML-% IV SOLN
INTRAVENOUS | Status: AC
  Filled 2018-06-04: qty 100

## 2018-06-04 MED ORDER — 0.9 % SODIUM CHLORIDE (POUR BTL) OPTIME
TOPICAL | Status: DC | PRN
  Administered 2018-06-04: 1000 mL

## 2018-06-04 MED ORDER — MIDAZOLAM HCL 2 MG/2ML IJ SOLN
INTRAMUSCULAR | Status: DC | PRN
  Administered 2018-06-04: 2 mg via INTRAVENOUS

## 2018-06-04 MED ORDER — ONDANSETRON HCL 4 MG PO TABS: 4.0000 mg | ORAL_TABLET | Freq: Four times a day (QID) | ORAL | Status: DC | PRN

## 2018-06-04 MED ORDER — LACTATED RINGERS IV SOLN
INTRAVENOUS | Status: DC
  Administered 2018-06-04 – 2018-06-05 (×2): via INTRAVENOUS

## 2018-06-04 MED ORDER — PHENOL 1.4 % MT LIQD: 1.0000 | OROMUCOSAL | Status: DC | PRN

## 2018-06-04 MED ORDER — ASPIRIN EC 81 MG PO TBEC: 81.0000 mg | DELAYED_RELEASE_TABLET | Freq: Two times a day (BID) | ORAL | 0 refills | Status: AC

## 2018-06-04 MED ORDER — MIDAZOLAM HCL 2 MG/2ML IJ SOLN
INTRAMUSCULAR | Status: AC
  Filled 2018-06-04: qty 2

## 2018-06-04 MED ORDER — MAGNESIUM CITRATE PO SOLN: 1.0000 | Freq: Once | ORAL | Status: DC | PRN

## 2018-06-04 MED ORDER — PHENYLEPHRINE HCL (PRESSORS) 10 MG/ML IV SOLN
INTRAVENOUS | Status: AC
  Filled 2018-06-04: qty 1

## 2018-06-04 MED ORDER — HYDROCODONE-ACETAMINOPHEN 5-325 MG PO TABS: 1.0000 | ORAL_TABLET | ORAL | Status: DC | PRN

## 2018-06-04 MED ORDER — ONDANSETRON HCL 4 MG PO TABS: 4.0000 mg | ORAL_TABLET | Freq: Three times a day (TID) | ORAL | 0 refills | Status: DC | PRN

## 2018-06-04 MED ORDER — DIPHENHYDRAMINE HCL 12.5 MG/5ML PO ELIX: 12.5000 mg | ORAL_SOLUTION | ORAL | Status: DC | PRN

## 2018-06-04 MED ORDER — PROPOFOL 500 MG/50ML IV EMUL
INTRAVENOUS | Status: DC | PRN
  Administered 2018-06-04: 125 ug/kg/min via INTRAVENOUS
  Administered 2018-06-04: 135 ug/kg/min via INTRAVENOUS

## 2018-06-04 MED ORDER — LACTATED RINGERS IV SOLN: INTRAVENOUS | Status: DC

## 2018-06-04 MED ORDER — PHENYLEPHRINE 40 MCG/ML (10ML) SYRINGE FOR IV PUSH (FOR BLOOD PRESSURE SUPPORT)
PREFILLED_SYRINGE | INTRAVENOUS | Status: AC
  Filled 2018-06-04: qty 10

## 2018-06-04 MED ORDER — DEXAMETHASONE SODIUM PHOSPHATE 10 MG/ML IJ SOLN
10.0000 mg | Freq: Once | INTRAMUSCULAR | Status: AC
  Administered 2018-06-05: 10 mg via INTRAVENOUS
  Filled 2018-06-04: qty 1

## 2018-06-04 MED ORDER — ACETAMINOPHEN 325 MG PO TABS: 325.0000 mg | ORAL_TABLET | Freq: Four times a day (QID) | ORAL | Status: DC | PRN

## 2018-06-04 MED ORDER — SORBITOL 70 % SOLN
30.0000 mL | Freq: Every day | Status: DC | PRN
  Filled 2018-06-04: qty 30

## 2018-06-04 MED ORDER — DOCUSATE SODIUM 100 MG PO CAPS: 100.0000 mg | ORAL_CAPSULE | Freq: Two times a day (BID) | ORAL | 0 refills | Status: DC

## 2018-06-04 MED ORDER — FENTANYL CITRATE (PF) 100 MCG/2ML IJ SOLN
25.0000 ug | INTRAMUSCULAR | Status: DC | PRN
  Administered 2018-06-04: 50 ug via INTRAVENOUS

## 2018-06-04 MED ORDER — SODIUM CHLORIDE (PF) 0.9 % IJ SOLN
INTRAMUSCULAR | Status: AC
  Filled 2018-06-04: qty 20

## 2018-06-04 MED ORDER — ONDANSETRON HCL 4 MG/2ML IJ SOLN
INTRAMUSCULAR | Status: DC | PRN
  Administered 2018-06-04: 4 mg via INTRAVENOUS

## 2018-06-04 MED ORDER — CELECOXIB 200 MG PO CAPS
200.0000 mg | ORAL_CAPSULE | Freq: Two times a day (BID) | ORAL | Status: DC
  Administered 2018-06-04 – 2018-06-05 (×3): 200 mg via ORAL
  Filled 2018-06-04 (×3): qty 1

## 2018-06-04 MED ORDER — CELECOXIB 200 MG PO CAPS: 200.0000 mg | ORAL_CAPSULE | Freq: Two times a day (BID) | ORAL | 0 refills | Status: AC

## 2018-06-04 MED ORDER — DEXAMETHASONE SODIUM PHOSPHATE 10 MG/ML IJ SOLN
INTRAMUSCULAR | Status: AC
  Filled 2018-06-04: qty 1

## 2018-06-04 MED ORDER — BUPIVACAINE LIPOSOME 1.3 % IJ SUSP
INTRAMUSCULAR | Status: DC | PRN
  Administered 2018-06-04: 20 mL

## 2018-06-04 MED ORDER — DOCUSATE SODIUM 100 MG PO CAPS
100.0000 mg | ORAL_CAPSULE | Freq: Two times a day (BID) | ORAL | Status: DC
  Administered 2018-06-04 – 2018-06-05 (×2): 100 mg via ORAL
  Filled 2018-06-04 (×2): qty 1

## 2018-06-04 MED ORDER — FENTANYL CITRATE (PF) 100 MCG/2ML IJ SOLN
INTRAMUSCULAR | Status: AC
  Filled 2018-06-04: qty 2

## 2018-06-04 MED ORDER — DEXAMETHASONE SODIUM PHOSPHATE 10 MG/ML IJ SOLN
INTRAMUSCULAR | Status: DC | PRN
  Administered 2018-06-04: 8 mg via INTRAVENOUS

## 2018-06-04 MED ORDER — BUPIVACAINE IN DEXTROSE 0.75-8.25 % IT SOLN
INTRATHECAL | Status: DC | PRN
  Administered 2018-06-04: 1.6 mL via INTRATHECAL

## 2018-06-04 MED ORDER — GABAPENTIN 300 MG PO CAPS: 300.0000 mg | ORAL_CAPSULE | Freq: Two times a day (BID) | ORAL | 0 refills | Status: DC

## 2018-06-04 MED ORDER — SODIUM CHLORIDE 0.9 % IV SOLN
INTRAVENOUS | Status: DC | PRN
  Administered 2018-06-04: 25 ug/min via INTRAVENOUS

## 2018-06-04 MED ORDER — PHENYLEPHRINE 40 MCG/ML (10ML) SYRINGE FOR IV PUSH (FOR BLOOD PRESSURE SUPPORT)
PREFILLED_SYRINGE | INTRAVENOUS | Status: DC | PRN
  Administered 2018-06-04 (×2): 120 ug via INTRAVENOUS

## 2018-06-04 MED ORDER — SCOPOLAMINE 1 MG/3DAYS TD PT72: 1.0000 | MEDICATED_PATCH | TRANSDERMAL | Status: DC

## 2018-06-04 MED ORDER — LACTATED RINGERS IV SOLN
INTRAVENOUS | Status: DC
  Administered 2018-06-04: 08:00:00 via INTRAVENOUS

## 2018-06-04 MED ORDER — ONDANSETRON HCL 4 MG/2ML IJ SOLN
INTRAMUSCULAR | Status: AC
  Filled 2018-06-04: qty 2

## 2018-06-04 MED ORDER — ONDANSETRON HCL 4 MG/2ML IJ SOLN: 4.0000 mg | Freq: Four times a day (QID) | INTRAMUSCULAR | Status: DC | PRN

## 2018-06-04 MED ORDER — POLYETHYLENE GLYCOL 3350 17 G PO PACK: 17.0000 g | PACK | Freq: Every day | ORAL | Status: DC | PRN

## 2018-06-04 MED ORDER — PROPOFOL 10 MG/ML IV BOLUS
INTRAVENOUS | Status: DC | PRN
  Administered 2018-06-04: 30 mg via INTRAVENOUS

## 2018-06-04 MED ORDER — CEFAZOLIN SODIUM-DEXTROSE 1-4 GM/50ML-% IV SOLN
1.0000 g | Freq: Four times a day (QID) | INTRAVENOUS | Status: AC
  Administered 2018-06-04 (×2): 1 g via INTRAVENOUS
  Filled 2018-06-04 (×2): qty 50

## 2018-06-04 MED ORDER — PROMETHAZINE HCL 25 MG/ML IJ SOLN: 6.2500 mg | INTRAMUSCULAR | Status: DC | PRN

## 2018-06-04 MED ORDER — MENTHOL 3 MG MT LOZG: 1.0000 | LOZENGE | OROMUCOSAL | Status: DC | PRN

## 2018-06-04 MED ORDER — LORATADINE 10 MG PO TABS
10.0000 mg | ORAL_TABLET | Freq: Every day | ORAL | Status: DC
  Administered 2018-06-05: 08:00:00 10 mg via ORAL
  Filled 2018-06-04: qty 1

## 2018-06-04 MED ORDER — ASPIRIN 81 MG PO CHEW
81.0000 mg | CHEWABLE_TABLET | Freq: Two times a day (BID) | ORAL | Status: DC
  Administered 2018-06-04 – 2018-06-05 (×2): 81 mg via ORAL
  Filled 2018-06-04 (×2): qty 1

## 2018-06-04 MED ORDER — OMEPRAZOLE 20 MG PO CPDR: 20.0000 mg | DELAYED_RELEASE_CAPSULE | Freq: Every day | ORAL | 0 refills | Status: DC

## 2018-06-04 MED ORDER — METHOCARBAMOL 1000 MG/10ML IJ SOLN
500.0000 mg | Freq: Four times a day (QID) | INTRAVENOUS | Status: DC | PRN
  Filled 2018-06-04: qty 5

## 2018-06-04 SURGICAL SUPPLY — 44 items
BLADE SAG 18X100X1.27 (BLADE) IMPLANT
BLADE SURG SZ10 CARB STEEL (BLADE) ×6 IMPLANT
CHLORAPREP W/TINT 26 (MISCELLANEOUS) ×3 IMPLANT
CLOSURE STERI-STRIP 1/2X4 (GAUZE/BANDAGES/DRESSINGS)
CLOSURE WOUND 1/2 X4 (GAUZE/BANDAGES/DRESSINGS) ×1
CLSR STERI-STRIP ANTIMIC 1/2X4 (GAUZE/BANDAGES/DRESSINGS) IMPLANT
COVER PERINEAL POST (MISCELLANEOUS) ×3 IMPLANT
COVER SURGICAL LIGHT HANDLE (MISCELLANEOUS) ×3 IMPLANT
COVER WAND RF STERILE (DRAPES) IMPLANT
DECANTER SPIKE VIAL GLASS SM (MISCELLANEOUS) ×6 IMPLANT
DRAPE IMP U-DRAPE 54X76 (DRAPES) ×3 IMPLANT
DRAPE STERI IOBAN 125X83 (DRAPES) ×3 IMPLANT
DRAPE U-SHAPE 47X51 STRL (DRAPES) ×6 IMPLANT
DRSG MEPILEX BORDER 4X8 (GAUZE/BANDAGES/DRESSINGS) IMPLANT
ELECT BLADE TIP CTD 4 INCH (ELECTRODE) ×3 IMPLANT
ELECT REM PT RETURN 15FT ADLT (MISCELLANEOUS) ×3 IMPLANT
GLOVE BIO SURGEON STRL SZ7.5 (GLOVE) ×6 IMPLANT
GLOVE BIOGEL PI IND STRL 8 (GLOVE) ×2 IMPLANT
GLOVE BIOGEL PI INDICATOR 8 (GLOVE) ×4
GOWN STRL REUS W/TWL LRG LVL3 (GOWN DISPOSABLE) ×3 IMPLANT
GOWN STRL REUS W/TWL XL LVL3 (GOWN DISPOSABLE) ×3 IMPLANT
HEAD BIOLOX HIP 36/-2.5 (Joint) ×1 IMPLANT
HIP BIOLOX HD 36/-2.5 (Joint) ×3 IMPLANT
HOLDER FOLEY CATH W/STRAP (MISCELLANEOUS) ×3 IMPLANT
INSERT POLYETHYLENE 36M-0 (Insert) ×3 IMPLANT
KIT TURNOVER KIT A (KITS) ×3 IMPLANT
MANIFOLD NEPTUNE II (INSTRUMENTS) ×3 IMPLANT
PACK ANTERIOR HIP CUSTOM (KITS) ×3 IMPLANT
PROTECTOR NERVE ULNAR (MISCELLANEOUS) IMPLANT
SCREW HEX LP 6.5X20 (Screw) ×3 IMPLANT
SHELL TRIDENT II CLUST 50 (Shell) ×3 IMPLANT
STEM HIP 4 127DEG (Stem) ×3 IMPLANT
STRIP CLOSURE SKIN 1/2X4 (GAUZE/BANDAGES/DRESSINGS) ×2 IMPLANT
SUT MNCRL AB 4-0 PS2 18 (SUTURE) ×3 IMPLANT
SUT STRATAFIX 0 PDS 27 VIOLET (SUTURE) ×3
SUT VIC AB 0 CT1 36 (SUTURE) ×3 IMPLANT
SUT VIC AB 1 CT1 36 (SUTURE) ×3 IMPLANT
SUT VIC AB 2-0 CT1 27 (SUTURE) ×4
SUT VIC AB 2-0 CT1 TAPERPNT 27 (SUTURE) ×2 IMPLANT
SUTURE STRATFX 0 PDS 27 VIOLET (SUTURE) ×1 IMPLANT
TRAY FOLEY MTR SLVR 14FR STAT (SET/KITS/TRAYS/PACK) ×3 IMPLANT
TRAY FOLEY MTR SLVR 16FR STAT (SET/KITS/TRAYS/PACK) IMPLANT
WATER STERILE IRR 1000ML POUR (IV SOLUTION) ×6 IMPLANT
YANKAUER SUCT BULB TIP 10FT TU (MISCELLANEOUS) ×3 IMPLANT

## 2018-06-04 NOTE — Interval H&P Note (Signed)
I participated in the care of this patient and agree with the above history, physical and evaluation. I performed a review of the history and a physical exam as detailed   Akhil Piscopo Daniel Kemonie Cutillo MD  

## 2018-06-04 NOTE — Anesthesia Procedure Notes (Signed)
Procedure Name: MAC Date/Time: 06/04/2018 10:11 AM Performed by: Niel Hummer, CRNA Pre-anesthesia Checklist: Patient identified, Emergency Drugs available, Suction available and Patient being monitored Patient Re-evaluated:Patient Re-evaluated prior to induction Oxygen Delivery Method: Simple face mask

## 2018-06-04 NOTE — Op Note (Signed)
06/04/2018  11:37 AM  PATIENT:  Tracy Castro   MRN: [id number]  PRE-OPERATIVE DIAGNOSIS:  OA LEFT HIP  POST-OPERATIVE DIAGNOSIS:  OA LEFT HIP  PROCEDURE:  Procedure(s): TOTAL HIP ARTHROPLASTY ANTERIOR APPROACH  PREOPERATIVE INDICATIONS:    Tracy Castro is an 58 y.o. female who has a diagnosis of Primary osteoarthritis of left hip and elected for surgical management after failing conservative treatment.  The risks benefits and alternatives were discussed with the patient including but not limited to the risks of nonoperative treatment, versus surgical intervention including infection, bleeding, nerve injury, periprosthetic fracture, the need for revision surgery, dislocation, leg length discrepancy, blood clots, cardiopulmonary complications, morbidity, mortality, among others, and they were willing to proceed.     OPERATIVE REPORT     SURGEON:   Renette Butters, MD    ASSISTANT:  Roxan Hockey, PA-C, he was present and scrubbed throughout the case, critical for completion in a timely fashion, and for retraction, instrumentation, and closure.     ANESTHESIA:  General    COMPLICATIONS:  None.     COMPONENTS:  Stryker acolade fit femur size 4 with a 36 mm -2.5 head ball and a PSL acetabular shell size 50 with a  polyethylene liner    PROCEDURE IN DETAIL:   The patient was met in the holding area and  identified.  The appropriate hip was identified and marked at the operative site.  The patient was then transported to the OR  and  placed under anesthesia per that record.  At that point, the patient was  placed in the supine position and  secured to the operating room table and all bony prominences padded. He received pre-operative antibiotics    The operative lower extremity was prepped from the iliac crest to the distal leg.  Sterile draping was performed.  Time out was performed prior to incision.      Skin incision was made just 2 cm lateral to the ASIS  extending in line with  the tensor fascia lata. Electrocautery was used to control all bleeders. I dissected down sharply to the fascia of the tensor fascia lata was confirmed that the muscle fibers beneath were running posteriorly. I then incised the fascia over the superficial tensor fascia lata in line with the incision. The fascia was elevated off the anterior aspect of the muscle the muscle was retracted posteriorly and protected throughout the case. I then used electrocautery to incise the tensor fascia lata fascia control and all bleeders. Immediately visible was the fat over top of the anterior neck and capsule.  I removed the anterior fat from the capsule and elevated the rectus muscle off of the anterior capsule. I then removed a large time of capsule. The retractors were then placed over the anterior acetabulum as well as around the superior and inferior neck.  I then made a femoral neck cut. Then used the power corkscrew to remove the femoral head from the acetabulum and thoroughly irrigated the acetabulum. I sized the femoral head.    I then exposed the deep acetabulum, cleared out any tissue including the ligamentum teres.   After adequate visualization, I excised the labrum, and then sequentially reamed.  I then impacted the acetabular implant into place using fluoroscopy for guidance.  Appropriate version and inclination was confirmed clinically matching their bony anatomy, and with fluoroscopy.  I placed a 20 mm screw in the posterior/superio position with an excellent bite.    I then placed the polyethylene  liner in place  I then adducted the leg and released the external rotators from the posterior femur allowing it to be easily delivered up lateral and anterior to the acetabulum for preparation of the femoral canal.    I then prepared the proximal femur using the cookie-cutter and then sequentially reamed and broached.  A trial broach, neck, and head was utilized, and I reduced the hip and used  floroscopy to assess the neck length and femoral implant.  I then impacted the femoral prosthesis into place into the appropriate version. The hip was then reduced and fluoroscopy confirmed appropriate position. Leg lengths were restored.  I then irrigated the hip copiously again with, and repaired the fascia with Vicryl, followed by monocryl for the subcutaneous tissue, Monocryl for the skin, Steri-Strips and sterile gauze. The patient was then awakened and returned to PACU in stable and satisfactory condition. There were no complications.  POST OPERATIVE PLAN: WBAT, DVT px: SCD's/TED, ambulation and chemical dvt px  Edmonia Lynch, MD Orthopedic Surgeon [phone]

## 2018-06-04 NOTE — Anesthesia Preprocedure Evaluation (Addendum)
Anesthesia Evaluation  Patient identified by MRN, date of birth, ID band Patient awake    Reviewed: Allergy & Precautions, NPO status , Patient's Chart, lab work & pertinent test results  History of Anesthesia Complications (+) PONV and history of anesthetic complications  Airway Mallampati: I  TM Distance: >3 FB Neck ROM: Full    Dental  (+) Dental Advisory Given, Teeth Intact   Pulmonary neg pulmonary ROS,    breath sounds clear to auscultation       Cardiovascular Exercise Tolerance: Good hypertension (no longer on meds since weight loss),  Rhythm:Regular Rate:Normal     Neuro/Psych negative neurological ROS  negative psych ROS   GI/Hepatic negative GI ROS, Neg liver ROS,   Endo/Other  negative endocrine ROS Obesity   Renal/GU negative Renal ROS     Musculoskeletal  (+) Arthritis ,   Abdominal   Peds  Hematology negative hematology ROS (+)   Anesthesia Other Findings Hx back surgery   Reproductive/Obstetrics                            Anesthesia Physical Anesthesia Plan  ASA: II  Anesthesia Plan: Spinal   Post-op Pain Management:    Induction:   PONV Risk Score and Plan: 3 and Treatment may vary due to age or medical condition and Propofol infusion  Airway Management Planned: Natural Airway and Simple Face Mask  Additional Equipment: None  Intra-op Plan:   Post-operative Plan:   Informed Consent: I have reviewed the patients History and Physical, chart, labs and discussed the procedure including the risks, benefits and alternatives for the proposed anesthesia with the patient or authorized representative who has indicated his/her understanding and acceptance.       Plan Discussed with: Anesthesiologist and CRNA  Anesthesia Plan Comments: (Labs reviewed, platelets acceptable. Discussed risks and benefits of spinal, including spinal/epidural hematoma, infection,  failed block, and PDPH. Patient expressed understanding and wished to proceed. )       Anesthesia Quick Evaluation

## 2018-06-04 NOTE — Anesthesia Procedure Notes (Signed)
Spinal  Patient location during procedure: OR Start time: 06/04/2018 10:12 AM End time: 06/04/2018 10:15 AM Staffing Resident/CRNA: Nelle Don, CRNA Performed: resident/CRNA  Preanesthetic Checklist Completed: patient identified, surgical consent, pre-op evaluation, IV checked, risks and benefits discussed and monitors and equipment checked Spinal Block Patient position: sitting Prep: DuraPrep Patient monitoring: blood pressure, continuous pulse ox and heart rate Approach: midline Location: L2-3 Injection technique: single-shot Needle Needle type: Pencan  Needle gauge: 24 G

## 2018-06-04 NOTE — Anesthesia Postprocedure Evaluation (Signed)
Anesthesia Post Note  Patient: Tracy Castro  Procedure(s) Performed: TOTAL HIP ARTHROPLASTY ANTERIOR APPROACH (Left Hip)     Patient location during evaluation: PACU Anesthesia Type: Spinal Level of consciousness: awake and alert Pain management: pain level controlled Vital Signs Assessment: post-procedure vital signs reviewed and stable Respiratory status: spontaneous breathing and respiratory function stable Cardiovascular status: blood pressure returned to baseline and stable Postop Assessment: spinal receding and no apparent nausea or vomiting Anesthetic complications: no    Last Vitals:  Vitals:   06/04/18 1400 06/04/18 1415  BP: 107/65 113/65  Pulse: 70 66  Resp: 15 14  Temp: 36.5 C   SpO2: 99% 100%    Last Pain:  Vitals:   06/04/18 1415  TempSrc:   PainSc: 0-No pain                 Beryle Lathe

## 2018-06-04 NOTE — Transfer of Care (Signed)
Immediate Anesthesia Transfer of Care Note  Patient: Tracy Castro  Procedure(s) Performed: TOTAL HIP ARTHROPLASTY ANTERIOR APPROACH (Left Hip)  Patient Location: PACU  Anesthesia Type:Spinal  Level of Consciousness: awake, alert  and oriented  Airway & Oxygen Therapy: Patient Spontanous Breathing and Patient connected to face mask oxygen  Post-op Assessment: Report given to RN and Post -op Vital signs reviewed and stable  Post vital signs: Reviewed and stable  Last Vitals:  Vitals Value Taken Time  BP 90/55 06/04/2018 12:20 PM  Temp    Pulse 70 06/04/2018 12:21 PM  Resp 14 06/04/2018 12:21 PM  SpO2 99 % 06/04/2018 12:21 PM  Vitals shown include unvalidated device data.  Last Pain:  Vitals:   06/04/18 0754  TempSrc:   PainSc: 3          Complications: No apparent anesthesia complications

## 2018-06-04 NOTE — Discharge Instructions (Signed)
You may bear weight as tolerated. °Keep your dressing on and dry until follow up. °Take medicine to prevent blood clots as directed. °Take pain medicine as needed with the goal of transitioning to over the counter medicines.  °If needed, you may increase breakthrough pain medication (Norco) for the first few days post op - up to 2 tablets every 4 hours.  Stop as this medication as soon as you are able. ° °INSTRUCTIONS AFTER JOINT REPLACEMENT  ° °o Remove items at home which could result in a fall. This includes throw rugs or furniture in walking pathways °o ICE to the affected joint every three hours while awake for 30 minutes at a time, for at least the first 3-5 days, and then as needed for pain and swelling.  Continue to use ice for pain and swelling. You may notice swelling that will progress down to the foot and ankle.  This is normal after surgery.  Elevate your leg when you are not up walking on it.   °o Continue to use the breathing machine you got in the hospital (incentive spirometer) which will help keep your temperature down.  It is common for your temperature to cycle up and down following surgery, especially at night when you are not up moving around and exerting yourself.  The breathing machine keeps your lungs expanded and your temperature down. ° ° °DIET:  As you were doing prior to hospitalization, we recommend a well-balanced diet. ° °DRESSING / WOUND CARE / SHOWERING ° °You may shower 3 days after surgery, but keep the wounds dry during showering.  You may use an occlusive plastic wrap (Press'n Seal for example) with blue painter's tape at edges, NO SOAKING/SUBMERGING IN THE BATHTUB.  If the bandage gets wet, change with a clean dry gauze.  If the incision gets wet, pat the wound dry with a clean towel. ° °ACTIVITY ° °o Increase activity slowly as tolerated, but follow the weight bearing instructions below.   °o No driving for 6 weeks or until further direction given by your physician.  You  cannot drive while taking narcotics.  °o No lifting or carrying greater than 10 lbs. until further directed by your surgeon. °o Avoid periods of inactivity such as sitting longer than an hour when not asleep. This helps prevent blood clots.  °o You may return to work once you are authorized by your doctor.  ° ° ° °WEIGHT BEARING  ° °Weight bearing as tolerated with assist device (walker, cane, etc) as directed, use it as long as suggested by your surgeon or therapist, typically at least 4-6 weeks. ° ° °EXERCISES ° °Results after joint replacement surgery are often greatly improved when you follow the exercise, range of motion and muscle strengthening exercises prescribed by your doctor. Safety measures are also important to protect the joint from further injury. Any time any of these exercises cause you to have increased pain or swelling, decrease what you are doing until you are comfortable again and then slowly increase them. If you have problems or questions, call your caregiver or physical therapist for advice.  ° °Rehabilitation is important following a joint replacement. After just a few days of immobilization, the muscles of the leg can become weakened and shrink (atrophy).  These exercises are designed to build up the tone and strength of the thigh and leg muscles and to improve motion. Often times heat used for twenty to thirty minutes before working out will loosen up your tissues and help   with improving the range of motion but do not use heat for the first two weeks following surgery (sometimes heat can increase post-operative swelling).  ° °These exercises can be done on a training (exercise) mat, on the floor, on a table or on a bed. Use whatever works the best and is most comfortable for you.    Use music or television while you are exercising so that the exercises are a pleasant break in your day. This will make your life better with the exercises acting as a break in your routine that you can look  forward to.   Perform all exercises about fifteen times, three times per day or as directed.  You should exercise both the operative leg and the other leg as well. ° °Exercises include: °  °• Quad Sets - Tighten up the muscle on the front of the thigh (Quad) and hold for 5-10 seconds.   °• Straight Leg Raises - With your knee straight (if you were given a brace, keep it on), lift the leg to 60 degrees, hold for 3 seconds, and slowly lower the leg.  Perform this exercise against resistance later as your leg gets stronger.  °• Leg Slides: Lying on your back, slowly slide your foot toward your buttocks, bending your knee up off the floor (only go as far as is comfortable). Then slowly slide your foot back down until your leg is flat on the floor again.  °• Angel Wings: Lying on your back spread your legs to the side as far apart as you can without causing discomfort.  °• Hamstring Strength:  Lying on your back, push your heel against the floor with your leg straight by tightening up the muscles of your buttocks.  Repeat, but this time bend your knee to a comfortable angle, and push your heel against the floor.  You may put a pillow under the heel to make it more comfortable if necessary.  ° °A rehabilitation program following joint replacement surgery can speed recovery and prevent re-injury in the future due to weakened muscles. Contact your doctor or a physical therapist for more information on knee rehabilitation.  ° ° °CONSTIPATION ° °Constipation is defined medically as fewer than three stools per week and severe constipation as less than one stool per week.  Even if you have a regular bowel pattern at home, your normal regimen is likely to be disrupted due to multiple reasons following surgery.  Combination of anesthesia, postoperative narcotics, change in appetite and fluid intake all can affect your bowels.  ° °YOU MUST use at least one of the following options; they are listed in order of increasing strength  to get the job done.  They are all available over the counter, and you may need to use some, POSSIBLY even all of these options:   ° °Drink plenty of fluids (prune juice may be helpful) and high fiber foods °Colace 100 mg by mouth twice a day  °Senokot for constipation as directed and as needed Dulcolax (bisacodyl), take with full glass of water  °Miralax (polyethylene glycol) once or twice a day as needed. ° °If you have tried all these things and are unable to have a bowel movement in the first 3-4 days after surgery call either your surgeon or your primary doctor.   ° °If you experience loose stools or diarrhea, hold the medications until you stool forms back up.  If your symptoms do not get better within 1 week or if they get   worse, check with your doctor.  If you experience "the worst abdominal pain ever" or develop nausea or vomiting, please contact the office immediately for further recommendations for treatment. ° ° °ITCHING:  If you experience itching with your medications, try taking only a single pain pill, or even half a pain pill at a time.  You can also use Benadryl over the counter for itching or also to help with sleep.  ° °TED HOSE STOCKINGS:  Use stockings on both legs until for at least 2 weeks or as directed by physician office. They may be removed at night for sleeping. ° °MEDICATIONS:  See your medication summary on the “After Visit Summary” that nursing will review with you.  You may have some home medications which will be placed on hold until you complete the course of blood thinner medication.  It is important for you to complete the blood thinner medication as prescribed. ° °PRECAUTIONS:  If you experience chest pain or shortness of breath - call 911 immediately for transfer to the hospital emergency department.  ° °If you develop a fever greater that 101 F, purulent drainage from wound, increased redness or drainage from wound, foul odor from the wound/dressing, or calf pain - CONTACT  YOUR SURGEON.   °                                                °FOLLOW-UP APPOINTMENTS:  If you do not already have a post-op appointment, please call the office for an appointment to be seen by your surgeon.  Guidelines for how soon to be seen are listed in your “After Visit Summary”, but are typically between 1-4 weeks after surgery. ° °OTHER INSTRUCTIONS:  ° ° ° °MAKE SURE YOU:  °• Understand these instructions.  °• Get help right away if you are not doing well or get worse.  ° ° °Thank you for letting us be a part of your medical care team.  It is a privilege we respect greatly.  We hope these instructions will help you stay on track for a fast and full recovery!  ° ° ° ° °

## 2018-06-04 NOTE — Evaluation (Signed)
Physical Therapy Evaluation Patient Details Name: Tracy Castro MRN: [id number] DOB: 1960-03-31 Today's Date: 06/04/2018   History of Present Illness  58 yo female s/p L DA-THA on 06/04/18. PMH includes HTN, lumbar surgery.   Clinical Impression  Pt presents with L hip pain, post-operative weakness, increased time and effort to perform mobility tasks, and decreased activity tolerance. Pt to benefit from acute PT to address deficits. Pt ambulated hallway distance with RW with min guard assist, verbal cuing for form and safety provided throughout. Pt educated on ankle pumps (20/hour) to perform this afternoon/evening to increase circulation, to pt's tolerance and limited by pain. PT to progress mobility as tolerated, and will continue to follow acutely.        Follow Up Recommendations Follow surgeon's recommendation for DC plan and follow-up therapies;Supervision for mobility/OOB    Equipment Recommendations  Rolling walker with 5" wheels;3in1 (PT)    Recommendations for Other Services       Precautions / Restrictions Precautions Precautions: Fall Restrictions Weight Bearing Restrictions: No Other Position/Activity Restrictions: WBAT      Mobility  Bed Mobility Overal bed mobility: Needs Assistance Bed Mobility: Supine to Sit     Supine to sit: Min guard;HOB elevated     General bed mobility comments: Min guard for safety, increased time and effort to perform.   Transfers Overall transfer level: Needs assistance Equipment used: Rolling walker (2 wheeled) Transfers: Sit to/from Stand Sit to Stand: Min guard;From elevated surface         General transfer comment: Min guard for safety, verbal cuing for hand placement when rising.   Ambulation/Gait Ambulation/Gait assistance: Min guard Gait Distance (Feet): 100 Feet Assistive device: Rolling walker (2 wheeled) Gait Pattern/deviations: Step-through pattern;Decreased stride length;Step-to pattern;Trunk  flexed;Antalgic Gait velocity: decr    General Gait Details: Min guard for safety, verbal cuing for for upright posture, sequencing, placement in RW.  Stairs            Wheelchair Mobility    Modified Rankin (Stroke Patients Only)       Balance Overall balance assessment: Mild deficits observed, not formally tested                                           Pertinent Vitals/Pain Pain Assessment: 0-10 Pain Score: 3  Pain Location: L hip  Pain Descriptors / Indicators: Sore;Tightness Pain Intervention(s): Repositioned;Monitored during session;Limited activity within patient's tolerance;Premedicated before session    Home Living Family/patient expects to be discharged to:: Private residence Living Arrangements: Spouse/significant other Available Help at Discharge: Family;Available 24 hours/day Type of Home: House Home Access: Stairs to enter Entrance Stairs-Rails: Lawyer of Steps: 4 Home Layout: One level Home Equipment: Shower seat - built in;Crutches      Prior Function Level of Independence: Independent               Hand Dominance   Dominant Hand: Right    Extremity/Trunk Assessment   Upper Extremity Assessment Upper Extremity Assessment: Overall WFL for tasks assessed    Lower Extremity Assessment Lower Extremity Assessment: Overall WFL for tasks assessed;LLE deficits/detail LLE Deficits / Details: suspected post-surgical weakness; able to perform ankle pumps, quad set, heel slides LLE Sensation: WNL    Cervical / Trunk Assessment Cervical / Trunk Assessment: Normal  Communication   Communication: No difficulties  Cognition Arousal/Alertness: Awake/alert Behavior During Therapy: [facility] for  tasks assessed/performed Overall Cognitive Status: Within Functional Limits for tasks assessed                                        General Comments      Exercises     Assessment/Plan     PT Assessment Patient needs continued PT services  PT Problem List Decreased strength;Decreased mobility;Decreased activity tolerance;Decreased balance;Pain;Decreased knowledge of use of DME       PT Treatment Interventions DME instruction;Functional mobility training;Balance training;Patient/family education;Gait training;Therapeutic activities;Stair training;Therapeutic exercise    PT Goals (Current goals can be found in the Care Plan section)  Acute Rehab PT Goals Patient Stated Goal: go home tomorrow PT Goal Formulation: With patient Time For Goal Achievement: 06/11/18 Potential to Achieve Goals: Good    Frequency 7X/week   Barriers to discharge        Co-evaluation               AM-PAC PT "6 Clicks" Mobility  Outcome Measure Help needed turning from your back to your side while in a flat bed without using bedrails?: A Little Help needed moving from lying on your back to sitting on the side of a flat bed without using bedrails?: A Little Help needed moving to and from a bed to a chair (including a wheelchair)?: A Little Help needed standing up from a chair using your arms (e.g., wheelchair or bedside chair)?: A Little Help needed to walk in hospital room?: A Little Help needed climbing 3-5 steps with a railing? : A Little 6 Click Score: 18    End of Session Equipment Utilized During Treatment: Gait belt Activity Tolerance: Patient tolerated treatment well Patient left: in chair;with call bell/phone within reach;with SCD's reapplied Nurse Communication: Mobility status PT Visit Diagnosis: Other abnormalities of gait and mobility (R26.89);Difficulty in walking, not elsewhere classified (R26.2)    Time: [id number] PT Time Calculation (min) (ACUTE ONLY): 22 min   Charges:   PT Evaluation $PT Eval Low Complexity: 1 Low          Sherman Donaldson Terrial Rhodes Harsha Yusko, PT Acute Rehabilitation Services Pager [phone]  Office [phone]  Pilar Corrales D Despina Hiddenure 06/04/2018, 7:17 PM

## 2018-06-05 ENCOUNTER — Encounter (HOSPITAL_COMMUNITY): Payer: Self-pay | Admitting: Orthopedic Surgery

## 2018-06-05 DIAGNOSIS — M1612 Unilateral primary osteoarthritis, left hip: Secondary | ICD-10-CM | POA: Diagnosis not present

## 2018-06-05 NOTE — Progress Notes (Signed)
    Subjective: Patient reports pain as mild, controlled.  Tolerating diet.  Urinating.  No CP, SOB.  Mobilizing well.  Objective:   VITALS:   Vitals:   06/04/18 1813 06/04/18 2104 06/05/18 0200 06/05/18 0639  BP: 122/72 123/71 (!) 103/56 (!) 109/59  Pulse: 74 75 (!) 53 (!) 53  Resp: 18 16 16 16   Temp: 98.2 F (36.8 C) 97.9 F (36.6 C) 97.8 F (36.6 C) 97.8 F (36.6 C)  TempSrc: Oral Oral Oral Oral  SpO2: 100% 97% 96% 93%  Weight:      Height:       CBC Latest Ref Rng & Units 05/24/2018  WBC 4.0 - 10.5 K/uL 8.6  Hemoglobin 12.0 - 15.0 g/dL 47.0  Hematocrit 76.1 - 46.0 % 41.2  Platelets 150 - 400 K/uL 367   BMP Latest Ref Rng & Units 05/24/2018  Glucose 70 - 99 mg/dL 518(D)  BUN 6 - 20 mg/dL 17  Creatinine 4.37 - 3.57 mg/dL 8.97  Sodium 847 - 841 mmol/L 142  Potassium 3.5 - 5.1 mmol/L 4.2  Chloride 98 - 111 mmol/L 107  CO2 22 - 32 mmol/L 27  Calcium 8.9 - 10.3 mg/dL 9.3   Intake/Output      06/02 0701 - 06/03 0700 06/03 0701 - 06/04 0700   P.O. 540    I.V. (mL/kg) 1807.2 (19.7)    IV Piggyback 250    Total Intake(mL/kg) 2597.2 (28.3)    Urine (mL/kg/hr) 2650    Stool 0    Blood 125    Total Output 2775    Net -177.8         Urine Occurrence 0 x    Stool Occurrence 0 x       Physical Exam: General: NAD.  Upright in bed eating breakfast.  Calm, conversant. Resp: No increased wob Cardio: regular rate and rhythm ABD soft Neurologically intact MSK LLE: Neurovascularly intact Sensation intact distally Feet warm Dorsiflexion/Plantar flexion intact Incision: dressing C/D/I  Assessment: 1 Day Post-Op  S/P Procedure(s) (LRB): TOTAL HIP ARTHROPLASTY ANTERIOR APPROACH (Left) by Dr. Jewel Baize. Eulah Pont on 06/04/2018  Principal Problem:   Primary osteoarthritis of left hip Active Problems:   Hypertension   Primary localized osteoarthritis of hip   Primary osteoarthritis, status post total hip arthroplasty Doing well postop day 1 Eating, drinking, and  voiding Pain controlled Mobilizing well  Plan: Up with therapy Incentive Spirometry Apply ice   Weight Bearing: Weight Bearing as Tolerated (WBAT) LLE Dressings: Maintain Mepilex.   VTE prophylaxis: Aspirin, SCDs, ambulation Dispo: Home today after a.m. therapy.   Albina Billet III, PA-C 06/05/2018, 7:33 AM

## 2018-06-05 NOTE — Progress Notes (Signed)
Physical Therapy Treatment Patient Details Name: Tracy Castro MRN: [id number] DOB: 06-29-1960 Today's Date: 06/05/2018    History of Present Illness 58 yo female s/p L DA-THA on 06/04/18. PMH includes HTN, lumbar surgery.     PT Comments    Pt sitting in chair, friendly and willing to participate with therapy today.  Began session reviewing HEP exercises, given printout and instructed exercises to assure correct mechanics.  Pt able to verbalize and demonstrate appropriate mechanics with minimal difficulty.  Gait training complete with RW for safety, initial cueing for heel strike and equalized stride length to normalize gait mechanics, no LOB with min guard during gait.  Stair training instructed with step to pattern and one handrail.  Pt able to demonstrate and verbalize appropriate sequence with good mechanics noted.  EOS pt left in chair with call bell within reach and ice applied to hip.  No reports of increased pain, just soreness with movements.  RN aware of status.    Follow Up Recommendations  Follow surgeon's recommendation for DC plan and follow-up therapies;Supervision for mobility/OOB     Equipment Recommendations  Rolling walker with 5" wheels;3in1 (PT)    Recommendations for Other Services       Precautions / Restrictions Precautions Precautions: Fall Restrictions Weight Bearing Restrictions: No Other Position/Activity Restrictions: WBAT    Mobility  Bed Mobility               General bed mobility comments: Pt sitting in chair upon therapist entrance  Transfers Overall transfer level: Modified independent Equipment used: Rolling walker (2 wheeled) Transfers: Sit to/from Stand Sit to Stand: Min guard         General transfer comment: Cueing for safety with handplacement STS  Ambulation/Gait Ambulation/Gait assistance: Min guard Gait Distance (Feet): 150 Feet Assistive device: Rolling walker (2 wheeled) Gait Pattern/deviations: Step-through  pattern;Decreased stride length;Step-to pattern;Trunk flexed;Antalgic Gait velocity: decr    General Gait Details: Min guard for safety, verbal cuing for for upright posture, sequencing, placement in RW.   Stairs Stairs: Yes Stairs assistance: Min guard Stair Management: One rail Left Number of Stairs: 3 General stair comments: Instructed step to pattern, able to verbalized and demonstrate appropriate mechanics   Wheelchair Mobility    Modified Rankin (Stroke Patients Only)       Balance                                            Cognition Arousal/Alertness: Awake/alert Behavior During Therapy: WFL for tasks assessed/performed Overall Cognitive Status: Within Functional Limits for tasks assessed                                        Exercises Total Joint Exercises Ankle Circles/Pumps: 20 reps;Seated(long sitting in recliner) Quad Sets: Left;Strengthening;Seated(long sitting in recliner) Short Arc Quad: Strengthening;Left;10 reps(long sitting in recliner) Heel Slides: Strengthening;AAROM;Left;10 reps(long sitting in recliner) Hip ABduction/ADduction: Both;10 reps;Seated(long sitting in recliner) Straight Leg Raises: Left;10 reps;Strengthening;Seated(long sitting in recliner) Long Arc Quad: Left;Strengthening;Seated Knee Flexion: Strengthening;Both;10 reps;Standing(standing in counter by sink) Marching in Standing: Strengthening;Both;10 reps;Standing(by counter at sink)    General Comments        Pertinent Vitals/Pain Pain Assessment: No/denies pain Pain Location: L hip  Pain Descriptors / Indicators: Sore;Tightness Pain Intervention(s): Monitored during session;Ice applied  Home Living                      Prior Function            PT Goals (current goals can now be found in the care plan section)      Frequency    7X/week      PT Plan Current plan remains appropriate    Co-evaluation               AM-PAC PT "6 Clicks" Mobility   Outcome Measure  Help needed turning from your back to your side while in a flat bed without using bedrails?: A Little Help needed moving from lying on your back to sitting on the side of a flat bed without using bedrails?: A Little Help needed moving to and from a bed to a chair (including a wheelchair)?: A Little Help needed standing up from a chair using your arms (e.g., wheelchair or bedside chair)?: A Little Help needed to walk in hospital room?: A Little Help needed climbing 3-5 steps with a railing? : A Little 6 Click Score: 18    End of Session Equipment Utilized During Treatment: Gait belt Activity Tolerance: Patient tolerated treatment well Patient left: in chair;with call bell/phone within reach Nurse Communication: Mobility status PT Visit Diagnosis: Other abnormalities of gait and mobility (R26.89);Difficulty in walking, not elsewhere classified (R26.2)     Time: [id number] PT Time Calculation (min) (ACUTE ONLY): 30 min  Charges:  $Gait Training: 8-22 mins $Therapeutic Exercise: 8-22 mins                     [address][name], LPTA; CBIS [phone]  Juel BurrowCockerham, [name] [name] 06/05/2018, 12:16 PM

## 2018-06-05 NOTE — Discharge Summary (Signed)
Discharge Summary  Patient ID: Tracy Castro MRN: [id number] DOB/AGE: 08/01/1960 58 y.o.  Admit date: 06/04/2018 Discharge date: 06/05/2018  Admission Diagnoses:  Primary osteoarthritis of left hip  Discharge Diagnoses:  Principal Problem:   Primary osteoarthritis of left hip Active Problems:   Hypertension   Primary localized osteoarthritis of hip   Past Medical History:  Diagnosis Date  . Allergy    seasonal  . Complication of anesthesia   . Hypertension   . PONV (postoperative nausea and vomiting)     Surgeries: Procedure(s): TOTAL HIP ARTHROPLASTY ANTERIOR APPROACH on 06/04/2018   Consultants (if any):   Discharged Condition: Improved  Hospital Course: Tracy Castro is an 58 y.o. female who was admitted 06/04/2018 with a diagnosis of Primary osteoarthritis of left hip and went to the operating room on 06/04/2018 and underwent the above named procedures.    She was given perioperative antibiotics:  Anti-infectives (From admission, onward)   Start     Dose/Rate Route Frequency Ordered Stop   06/04/18 1600  ceFAZolin (ANCEF) IVPB 1 g/50 mL premix     1 g 100 mL/hr over 30 Minutes Intravenous Every 6 hours 06/04/18 1445 06/04/18 2200   06/04/18 0745  ceFAZolin (ANCEF) IVPB 2g/100 mL premix     2 g 200 mL/hr over 30 Minutes Intravenous On call to O.R. 06/04/18 0733 06/04/18 1045   06/04/18 0739  ceFAZolin (ANCEF) 2-4 GM/100ML-% IVPB    Note to Pharmacy:  Vevelyn Royals  : cabinet override      06/04/18 0739 06/04/18 1015    .  She was given sequential compression devices, early ambulation, and aspirin for DVT prophylaxis.  She benefited maximally from the hospital stay and there were no complications.    Recent vital signs:  Vitals:   06/05/18 0200 06/05/18 0639  BP: (!) 103/56 (!) 109/59  Pulse: (!) 53 (!) 53  Resp: 16 16  Temp: 97.8 F (36.6 C) 97.8 F (36.6 C)  SpO2: 96% 93%    Recent laboratory studies:  Lab Results  Component Value Date   HGB 13.7  05/24/2018   Lab Results  Component Value Date   WBC 8.6 05/24/2018   PLT 367 05/24/2018   No results found for: INR Lab Results  Component Value Date   NA 142 05/24/2018   K 4.2 05/24/2018   CL 107 05/24/2018   CO2 27 05/24/2018   BUN 17 05/24/2018   CREATININE 0.67 05/24/2018   GLUCOSE 118 (H) 05/24/2018    Discharge Medications:   Allergies as of 06/05/2018   No Known Allergies     Medication List    TAKE these medications   aspirin EC 81 MG tablet Take 1 tablet (81 mg total) by mouth 2 (two) times daily. For DVT prophylaxis for 30 days after surgery.   celecoxib 200 MG capsule Commonly known as:  CeleBREX Take 1 capsule (200 mg total) by mouth 2 (two) times daily for 14 days. For 2 weeks post op for pain and inflammation.  Discontinue Ibuprofen or other Anti-inflammatory medicine when taking this medicine.   cetirizine 10 MG tablet Commonly known as:  ZYRTEC Take 10 mg by mouth daily.   docusate sodium 100 MG capsule Commonly known as:  Colace Take 1 capsule (100 mg total) by mouth 2 (two) times daily. To prevent constipation while taking pain medication.   gabapentin 300 MG capsule Commonly known as:  Neurontin Take 1 capsule (300 mg total) by mouth 2 (two) times daily  for 14 days. For 2 weeks post op for pain.   HYDROcodone-acetaminophen 5-325 MG tablet Commonly known as:  Norco Take 1-2 tablets by mouth every 6 (six) hours as needed for up to 5 days for moderate pain.   methocarbamol 500 MG tablet Commonly known as:  Robaxin Take 1 tablet (500 mg total) by mouth every 8 (eight) hours as needed for muscle spasms.   omeprazole 20 MG capsule Commonly known as:  PriLOSEC Take 1 capsule (20 mg total) by mouth daily. 30 days for gastroprotection while taking NSAIDs.   ondansetron 4 MG tablet Commonly known as:  Zofran Take 1 tablet (4 mg total) by mouth every 8 (eight) hours as needed for nausea or vomiting.       Diagnostic Studies: Dg C-arm 1-60  Min-no Report  Result Date: 06/04/2018 Fluoroscopy was utilized by the requesting physician.  No radiographic interpretation.   Dg Hip Operative Unilat W Or W/o Pelvis Left  Result Date: 06/04/2018 CLINICAL DATA:  Total hip replacement EXAM: OPERATIVE LEFT HIP WITH PELVIS COMPARISON:  None. FLUOROSCOPY TIME:  Radiation Exposure Index (as provided by the fluoroscopic device): Not available If the device does not provide the exposure index: Fluoroscopy Time:  16 seconds Number of Acquired Images:  4 FINDINGS: Four spot films were obtained intraoperatively during placement of a left hip prosthesis. Prosthesis is noted in satisfactory position. No soft tissue or bony abnormality is noted. IMPRESSION: Left hip prosthesis Electronically Signed   By: Alcide CleverMark  Lukens M.D.   On: 06/04/2018 12:12    Disposition: Discharge disposition: 01-Home or Self Care       Discharge Instructions    Discharge patient   Complete by:  As directed    Discharge disposition:  01-Home or Self Care   Discharge patient date:  06/05/2018      Follow-up Information    Sheral ApleyMurphy, Timothy D, MD.   Specialty:  Orthopedic Surgery Contact information: [address] [place] [id number] [phone]            Signed: Albina BilletHenry Calvin Martensen III PA-C 06/05/2018, 7:36 AM

## 2018-08-21 ENCOUNTER — Other Ambulatory Visit (HOSPITAL_COMMUNITY): Payer: Self-pay | Admitting: Physician Assistant

## 2018-08-21 DIAGNOSIS — Z1231 Encounter for screening mammogram for malignant neoplasm of breast: Secondary | ICD-10-CM

## 2018-11-06 ENCOUNTER — Ambulatory Visit (HOSPITAL_COMMUNITY)
Admission: RE | Admit: 2018-11-06 | Discharge: 2018-11-06 | Disposition: A | Discharge status: Home / Self Care | Payer: BC Managed Care – PPO | Source: Ambulatory Visit | Attending: Physician Assistant | Admitting: Physician Assistant

## 2018-11-06 ENCOUNTER — Other Ambulatory Visit: Payer: Self-pay

## 2018-11-06 ENCOUNTER — Ambulatory Visit (HOSPITAL_COMMUNITY): Payer: BC Managed Care – PPO

## 2018-11-06 DIAGNOSIS — Z1231 Encounter for screening mammogram for malignant neoplasm of breast: Secondary | ICD-10-CM | POA: Insufficient documentation

## 2019-02-14 ENCOUNTER — Other Ambulatory Visit: Payer: Self-pay

## 2019-02-14 ENCOUNTER — Ambulatory Visit: Payer: BC Managed Care – PPO | Attending: Internal Medicine

## 2019-02-14 DIAGNOSIS — Z20822 Contact with and (suspected) exposure to covid-19: Secondary | ICD-10-CM

## 2019-02-15 LAB — NOVEL CORONAVIRUS, NAA: SARS-CoV-2, NAA: NOT DETECTED

## 2020-03-30 ENCOUNTER — Encounter: Payer: Self-pay | Admitting: Emergency Medicine

## 2020-03-30 ENCOUNTER — Ambulatory Visit
Admission: EM | Admit: 2020-03-30 | Discharge: 2020-03-30 | Disposition: A | Discharge status: Home / Self Care | Payer: BC Managed Care – PPO | Attending: Emergency Medicine | Admitting: Emergency Medicine

## 2020-03-30 ENCOUNTER — Other Ambulatory Visit: Payer: Self-pay

## 2020-03-30 DIAGNOSIS — R300 Dysuria: Secondary | ICD-10-CM | POA: Insufficient documentation

## 2020-03-30 DIAGNOSIS — N3001 Acute cystitis with hematuria: Secondary | ICD-10-CM | POA: Diagnosis present

## 2020-03-30 LAB — POCT URINALYSIS DIP (MANUAL ENTRY)
Bilirubin, UA: NEGATIVE
Glucose, UA: 100 mg/dL — AB
Ketones, POC UA: NEGATIVE mg/dL
Nitrite, UA: POSITIVE — AB
Protein Ur, POC: 30 mg/dL — AB
Spec Grav, UA: 1.025 (ref 1.010–1.025)
Urobilinogen, UA: 1 E.U./dL
pH, UA: 5 (ref 5.0–8.0)

## 2020-03-30 MED ORDER — NITROFURANTOIN MONOHYD MACRO 100 MG PO CAPS: 100.0000 mg | ORAL_CAPSULE | Freq: Two times a day (BID) | ORAL | 0 refills | Status: AC

## 2020-03-30 MED ORDER — PHENAZOPYRIDINE HCL 200 MG PO TABS: 200.0000 mg | ORAL_TABLET | Freq: Three times a day (TID) | ORAL | 0 refills | Status: AC

## 2020-03-30 NOTE — ED Triage Notes (Signed)
Pain on urination x 2 days.  Have tried AZO for relief.

## 2020-03-30 NOTE — Discharge Instructions (Signed)
Urine concerning for infection Urine culture sent.  We will call you with abnormal results.   Push fluids and get plenty of rest.   Take antibiotic as directed and to completion Take pyridium as prescribed and as needed for symptomatic relief Follow up with PCP if symptoms persists Return here or go to ER if you have any new or worsening symptoms such as fever, worsening abdominal pain, nausea/vomiting, flank pain, etc..Marland Kitchen

## 2020-03-30 NOTE — ED Provider Notes (Signed)
[facility]   CC: Burning with urination  SUBJECTIVE:  Tracy Castro is a 60 y.o. female who complains of dysuria for the past 2-3 days.  Patient reports delayed bathroom breaks.  Works as a Runner, broadcasting/film/video.  Denies abdominal or back pain.  Has tried OTC medications without relief.  Symptoms are made worse with urination.  Admits to similar symptoms in the past.  Denies fever, chills, nausea, vomiting, abdominal pain, flank pain, abnormal vaginal discharge or bleeding, hematuria.    LMP: Patient's last menstrual period was 02/02/2001.  ROS: As in HPI.  All other pertinent ROS negative.     Past Medical History:  Diagnosis Date  . Allergy    seasonal  . Complication of anesthesia   . Hypertension   . PONV (postoperative nausea and vomiting)    Past Surgical History:  Procedure Laterality Date  . BACK SURGERY  1985   lower lumbar  . CESAREAN SECTION  1995  . TOTAL HIP ARTHROPLASTY Left 06/04/2018   Procedure: TOTAL HIP ARTHROPLASTY ANTERIOR APPROACH;  Surgeon: Sheral Apley, MD;  Location: [facility];  Service: Orthopedics;  Laterality: Left;   No Known Allergies No current facility-administered medications on file prior to encounter.   Current Outpatient Medications on File Prior to Encounter  Medication Sig Dispense Refill  . hydrochlorothiazide (HYDRODIURIL) 25 MG tablet Take 25 mg by mouth daily.    Marland Kitchen lisinopril (ZESTRIL) 5 MG tablet Take 5 mg by mouth daily.    Marland Kitchen aspirin EC 81 MG tablet Take 1 tablet (81 mg total) by mouth 2 (two) times daily. For DVT prophylaxis for 30 days after surgery. 60 tablet 0  . cetirizine (ZYRTEC) 10 MG tablet Take 10 mg by mouth daily.    . [DISCONTINUED] gabapentin (NEURONTIN) 300 MG capsule Take 1 capsule (300 mg total) by mouth 2 (two) times daily for 14 days. For 2 weeks post op for pain. 28 capsule 0  . [DISCONTINUED] omeprazole (PRILOSEC) 20 MG capsule Take 1 capsule (20 mg total) by mouth daily. 30 days for gastroprotection while taking  NSAIDs. 30 capsule 0   Social History   Socioeconomic History  . Marital status: Married    Spouse name: Not on file  . Number of children: Not on file  . Years of education: Not on file  . Highest education level: Not on file  Occupational History  . Not on file  Tobacco Use  . Smoking status: Never Smoker  . Smokeless tobacco: Never Used  Vaping Use  . Vaping Use: Never used  Substance and Sexual Activity  . Alcohol use: No  . Drug use: No  . Sexual activity: Not on file  Other Topics Concern  . Not on file  Social History Narrative  . Not on file   Social Determinants of Health   Financial Resource Strain: Not on file  Food Insecurity: Not on file  Transportation Needs: Not on file  Physical Activity: Not on file  Stress: Not on file  Social Connections: Not on file  Intimate Partner Violence: Not on file   History reviewed. No pertinent family history.  OBJECTIVE:  Vitals:   03/30/20 1622  BP: 135/80  Pulse: 98  Resp: 18  Temp: 98.8 F (37.1 C)  TempSrc: Oral  SpO2: 95%   General appearance: Alert in no acute distress HEENT: NCAT.  Oropharynx clear.  Lungs: clear to auscultation bilaterally without adventitious breath sounds Heart: regular rate and rhythm.  Abdomen: soft; non-distended; no tenderness;  bowel sounds present; no guarding or rebound tenderness Extremities: no edema; symmetrical with no gross deformities Skin: warm and dry Neurologic: Ambulates from chair to exam table without difficulty Psychological: alert and cooperative; normal mood and affect  Labs Reviewed  POCT URINALYSIS DIP (MANUAL ENTRY) - Abnormal; Notable for the following components:      Result Value   Color, UA orange (*)    Glucose, UA =100 (*)    Blood, UA moderate (*)    Protein Ur, POC =30 (*)    Nitrite, UA Positive (*)    Leukocytes, UA Trace (*)    All other components within normal limits  URINE CULTURE    ASSESSMENT & PLAN:  1. Dysuria   2. Acute  cystitis with hematuria     Meds ordered this encounter  Medications  . nitrofurantoin, macrocrystal-monohydrate, (MACROBID) 100 MG capsule    Sig: Take 1 capsule (100 mg total) by mouth 2 (two) times daily.    Dispense:  10 capsule    Refill:  0    Order Specific Question:   Supervising Provider    Answer:   Eustace Moore [[id number]]  . phenazopyridine (PYRIDIUM) 200 MG tablet    Sig: Take 1 tablet (200 mg total) by mouth 3 (three) times daily.    Dispense:  6 tablet    Refill:  0    Order Specific Question:   Supervising Provider    Answer:   Eustace Moore [[id number]]   Urine concerning for infection Urine culture sent.  We will call you with abnormal results.   Push fluids and get plenty of rest.   Take antibiotic as directed and to completion Take pyridium as prescribed and as needed for symptomatic relief Follow up with PCP if symptoms persists Return here or go to ER if you have any new or worsening symptoms such as fever, worsening abdominal pain, nausea/vomiting, flank pain, etc...  Outlined signs and symptoms indicating need for more acute intervention. Patient verbalized understanding. After Visit Summary given.     Rennis Harding, PA-C 03/30/20 1644

## 2020-04-01 LAB — URINE CULTURE: Culture: 60000 — AB

## 2020-06-28 ENCOUNTER — Ambulatory Visit
Admission: EM | Admit: 2020-06-28 | Discharge: 2020-06-28 | Disposition: A | Discharge status: Home / Self Care | Payer: BC Managed Care – PPO | Attending: Family Medicine | Admitting: Family Medicine

## 2020-06-28 ENCOUNTER — Other Ambulatory Visit: Payer: Self-pay

## 2020-06-28 DIAGNOSIS — H6502 Acute serous otitis media, left ear: Secondary | ICD-10-CM

## 2020-06-28 DIAGNOSIS — R031 Nonspecific low blood-pressure reading: Secondary | ICD-10-CM

## 2020-06-28 MED ORDER — CEPHALEXIN 500 MG PO CAPS: 1000.0000 mg | ORAL_CAPSULE | Freq: Two times a day (BID) | ORAL | 0 refills | Status: AC

## 2020-06-28 NOTE — ED Triage Notes (Signed)
Pt presents with bilateral ear pain worsening x 1 week. No drainage, no change in hearing.  Has h/o of ear infections as adult.   Pt also concerned she may be having episodes of hypotension after losing nearly 30lbs.  Has has 2-3 episodes in last week in which she felt nauseated and dizzy.  One occurred after amusement park ride.  Denies SOB, CP during these episodes. Unsure if these episodes have occurred prior to ear pain.  Is still on same BP meds as prior to weight loss and has periodically taken BP at home and weight loss clinic, sometimes as low as 90s/60s.  PCP has no appts available.

## 2020-06-28 NOTE — ED Provider Notes (Signed)
[facility]    CSN: [id number] Arrival date & time: 06/28/20  1039      History   Chief Complaint Chief Complaint  Patient presents with   Otalgia    bilateral   Hypotension    HPI Tracy Castro is a 60 y.o. female.   HPI Bilateral ear pain x 1 week with gradually worsening of symptoms. Reports left ear is more painful compared to right.  Denies any drainage from right ear. Has chronic allergies.  Reports dizziness and nausea over the last few days, checked BP and readings have been as low 90/60. She is currently prescribed 2 blood pressure medicines however reports she has lost a significant amount of weight recently and is concerned that her medications need to be adjusted.  She is followed by primary care provider who she has seen earlier this year but has not reached out to their office regarding low blood pressure readings. Past Medical History:  Diagnosis Date   Allergy    seasonal   Complication of anesthesia    Hypertension    PONV (postoperative nausea and vomiting)     Patient Active Problem List   Diagnosis Date Noted   Primary localized osteoarthritis of hip 06/04/2018   Hypertension 05/21/2018   Primary osteoarthritis of left hip 05/21/2018    Past Surgical History:  Procedure Laterality Date   BACK SURGERY  1985   lower lumbar   CESAREAN SECTION  1995   TOTAL HIP ARTHROPLASTY Left 06/04/2018   Procedure: TOTAL HIP ARTHROPLASTY ANTERIOR APPROACH;  Surgeon: Sheral Apley, MD;  Location: [facility];  Service: Orthopedics;  Laterality: Left;    OB History   No obstetric history on file.      Home Medications    Prior to Admission medications   Medication Sig Start Date End Date Taking? Authorizing Provider  cephALEXin (KEFLEX) 500 MG capsule Take 2 capsules (1,000 mg total) by mouth 2 (two) times daily for 7 days. 06/28/20 07/05/20 Yes Bing Neighbors, FNP  cetirizine (ZYRTEC) 10 MG tablet Take 10 mg by mouth daily.   Yes [provider]  hydrochlorothiazide (HYDRODIURIL) 25 MG tablet Take 25 mg by mouth daily.   Yes [provider]  lisinopril (ZESTRIL) 5 MG tablet Take 5 mg by mouth daily.   Yes [provider]  aspirin EC 81 MG tablet Take 1 tablet (81 mg total) by mouth 2 (two) times daily. For DVT prophylaxis for 30 days after surgery. 06/04/18   Martensen, Lucretia Kern III, PA-C  nitrofurantoin, macrocrystal-monohydrate, (MACROBID) 100 MG capsule Take 1 capsule (100 mg total) by mouth 2 (two) times daily. 03/30/20   Wurst, [place], PA-C  phenazopyridine (PYRIDIUM) 200 MG tablet Take 1 tablet (200 mg total) by mouth 3 (three) times daily. 03/30/20   Wurst, [place], PA-C  gabapentin (NEURONTIN) 300 MG capsule Take 1 capsule (300 mg total) by mouth 2 (two) times daily for 14 days. For 2 weeks post op for pain. 06/04/18 03/30/20  Albina Billet III, PA-C  omeprazole (PRILOSEC) 20 MG capsule Take 1 capsule (20 mg total) by mouth daily. 30 days for gastroprotection while taking NSAIDs. 06/04/18 03/30/20  Albina Billet III, PA-C    Family History History reviewed. No pertinent family history.  Social History Social History   Tobacco Use   Smoking status: Never   Smokeless tobacco: Never  Vaping Use   Vaping Use: Never used  Substance Use Topics   Alcohol use: No  Drug use: No     Allergies   Patient has no known allergies.   Review of Systems Review of Systems Pertinent negatives listed in HPI   Physical Exam Triage Vital Signs ED Triage Vitals  Enc Vitals Group     BP 06/28/20 1102 112/75     Pulse Rate 06/28/20 1102 84     Resp 06/28/20 1102 17     Temp 06/28/20 1102 97.6 F (36.4 C)     Temp Source 06/28/20 1102 Tympanic     SpO2 06/28/20 1102 95 %     Weight --      Height --      Head Circumference --      Peak Flow --      Pain Score 06/28/20 1118 0     Pain Loc --      Pain Edu? --      Excl. in GC? --    No data found.  Updated Vital  Signs BP 112/75 (BP Location: Right Arm)   Pulse 84   Temp 97.6 F (36.4 C) (Tympanic)   Resp 17   LMP 02/02/2001   SpO2 95%   Visual Acuity Right Eye Distance:   Left Eye Distance:   Bilateral Distance:    Right Eye Near:   Left Eye Near:    Bilateral Near:     Physical Exam Constitutional:      Appearance: She is obese.  HENT:     Head: Normocephalic and atraumatic.     Right Ear: Hearing and ear canal normal. A middle ear effusion is present.     Left Ear: Hearing normal. Swelling and tenderness present. A middle ear effusion is present. Tympanic membrane is erythematous.     Nose: Congestion present.  Cardiovascular:     Rate and Rhythm: Normal rate and regular rhythm.  Pulmonary:     Effort: Pulmonary effort is normal.  Neurological:     General: No focal deficit present.     Mental Status: She is alert.  Psychiatric:        Mood and Affect: Mood normal.        Thought Content: Thought content normal.        Judgment: Judgment normal.     UC Treatments / Results  Labs (all labs ordered are listed, but only abnormal results are displayed) Labs Reviewed - No data to display  EKG   Radiology No results found.  Procedures Procedures (including critical care time)  Medications Ordered in UC Medications - No data to display  Initial Impression / Assessment and Plan / UC Course  I have reviewed the triage vital signs and the nursing notes.  Pertinent labs & imaging results that were available during my care of the patient were reviewed by me and considered in my medical decision making (see chart for details).     Left otitis media treatment today with Keflex 1000 mg twice daily for 7 days. Low blood pressure readings patient advised to follow-up with her primary care doctor as her medications may need adjusting.  Advised that in the setting of urgent care this would be inappropriate as she likely will require close monitoring and follow-up in order to  achieve normotensive blood pressure reading.  Patient advised that she will contact her primary doctor. Final Clinical Impressions(s) / UC Diagnoses   Final diagnoses:  Non-recurrent acute serous otitis media of left ear  Low blood pressure reading     Discharge Instructions  Contact your primary care doctor regarding your low home blood pressure readings to discuss adjustments of current blood pressure regimen.    ED Prescriptions     Medication Sig Dispense Auth. Provider   cephALEXin (KEFLEX) 500 MG capsule Take 2 capsules (1,000 mg total) by mouth 2 (two) times daily for 7 days. 28 capsule Bing Neighbors, FNP      PDMP not reviewed this encounter.   Bing Neighbors, FNP 06/28/20 [phone]

## 2020-06-28 NOTE — Discharge Instructions (Addendum)
Contact your primary care doctor regarding your low home blood pressure readings to discuss adjustments of current blood pressure regimen.

## 2021-06-16 ENCOUNTER — Other Ambulatory Visit (HOSPITAL_COMMUNITY): Payer: Self-pay | Admitting: Internal Medicine

## 2021-06-16 ENCOUNTER — Ambulatory Visit (HOSPITAL_COMMUNITY)
Admission: RE | Admit: 2021-06-16 | Discharge: 2021-06-16 | Disposition: A | Discharge status: Home / Self Care | Payer: BC Managed Care – PPO | Source: Ambulatory Visit | Attending: Internal Medicine | Admitting: Internal Medicine

## 2021-06-16 DIAGNOSIS — Z1231 Encounter for screening mammogram for malignant neoplasm of breast: Secondary | ICD-10-CM

## 2021-09-09 ENCOUNTER — Encounter: Payer: Self-pay | Admitting: Internal Medicine

## 2023-09-11 ENCOUNTER — Other Ambulatory Visit (HOSPITAL_COMMUNITY): Payer: Self-pay | Admitting: Sports Medicine

## 2023-09-11 DIAGNOSIS — M25551 Pain in right hip: Secondary | ICD-10-CM

## 2023-09-14 ENCOUNTER — Ambulatory Visit (HOSPITAL_COMMUNITY)
Admission: RE | Admit: 2023-09-14 | Discharge: 2023-09-14 | Disposition: A | Discharge status: Home / Self Care | Source: Ambulatory Visit | Attending: Sports Medicine | Admitting: Sports Medicine

## 2023-09-14 DIAGNOSIS — M25551 Pain in right hip: Secondary | ICD-10-CM | POA: Insufficient documentation

## 2023-09-14 MED ORDER — IOHEXOL 180 MG/ML  SOLN: 10.0000 mL | Freq: Once | INTRAMUSCULAR | Status: DC | PRN

## 2023-09-14 MED ORDER — LIDOCAINE HCL (PF) 1 % IJ SOLN
5.0000 mL | Freq: Once | INTRAMUSCULAR | Status: AC
  Administered 2023-09-14: 5 mL via INTRADERMAL

## 2023-09-14 MED ORDER — BUPIVACAINE HCL (PF) 0.5 % IJ SOLN
30.0000 mL | Freq: Once | INTRAMUSCULAR | Status: AC
  Administered 2023-09-14: 5 mL

## 2023-09-14 MED ORDER — TRIAMCINOLONE ACETONIDE INTRAVITREAL INJECTION 4 MG/0.1 ML
INTRAOCULAR | Status: AC
  Filled 2023-09-14: qty 1

## 2023-09-14 MED ORDER — BUPIVACAINE HCL (PF) 0.5 % IJ SOLN
INTRAMUSCULAR | Status: AC
  Filled 2023-09-14: qty 30

## 2023-09-14 MED ORDER — TRIAMCINOLONE ACETONIDE 40 MG/ML IJ SUSP (RADIOLOGY)
40.0000 mg | Freq: Once | INTRAMUSCULAR | Status: AC
  Administered 2023-09-14: 40 mg via INTRA_ARTICULAR

## 2023-09-14 MED ORDER — LIDOCAINE HCL (PF) 1 % IJ SOLN
INTRAMUSCULAR | Status: AC
Start: 2023-09-14 — End: 2023-09-14
  Filled 2023-09-14: qty 5

## 2024-01-22 ENCOUNTER — Encounter (INDEPENDENT_AMBULATORY_CARE_PROVIDER_SITE_OTHER): Payer: Self-pay | Admitting: *Deleted

## 2024-03-06 IMAGING — MG MM DIGITAL SCREENING BILAT W/ TOMO AND CAD
6 of 10 series · 6 of 30 positions shown · non-contrast
Comparison: Previous exam(s).

ACR Breast Density Category a: The breast tissue is almost entirely
fatty.

CLINICAL DATA: Screening.

EXAM:
DIGITAL SCREENING BILATERAL MAMMOGRAM WITH TOMOSYNTHESIS AND CAD
TECHNIQUE: Bilateral screening digital craniocaudal and mediolateral oblique
mammograms were obtained. Bilateral screening digital breast
tomosynthesis was performed. The images were evaluated with
computer-aided detection.

[L MLO synth-2D]
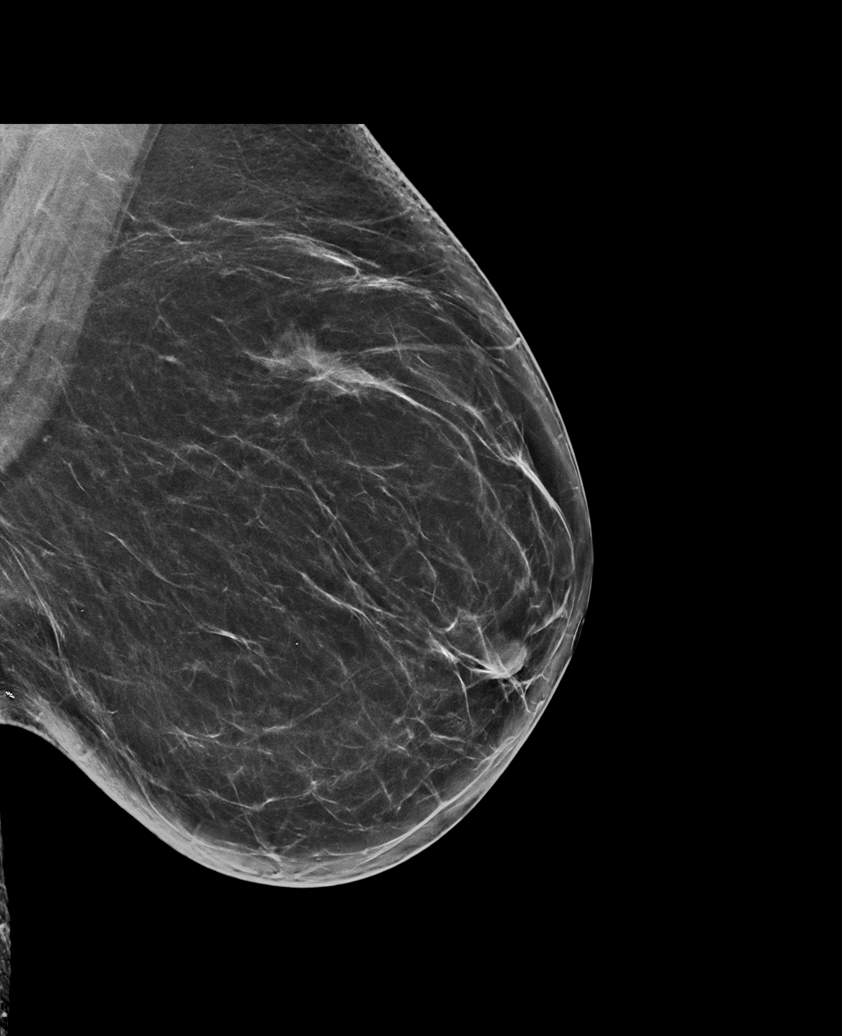

[R CC synth-2D]
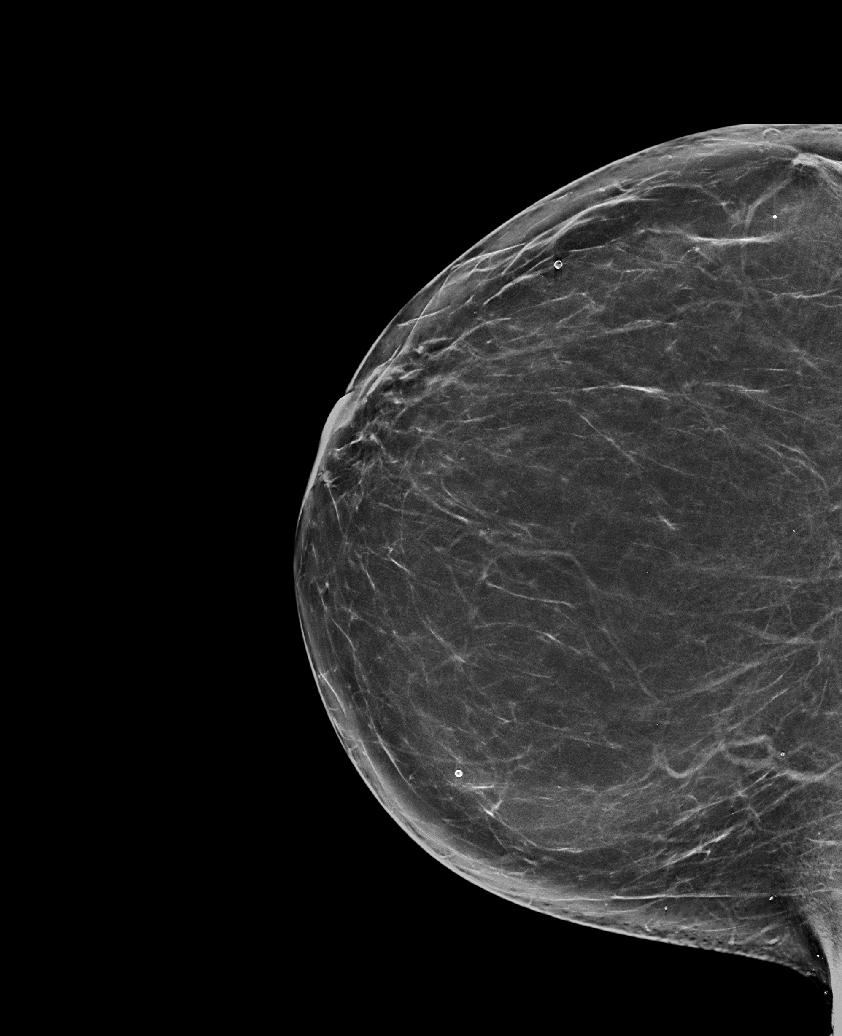

[L CC synth-2D (1 of 2)]
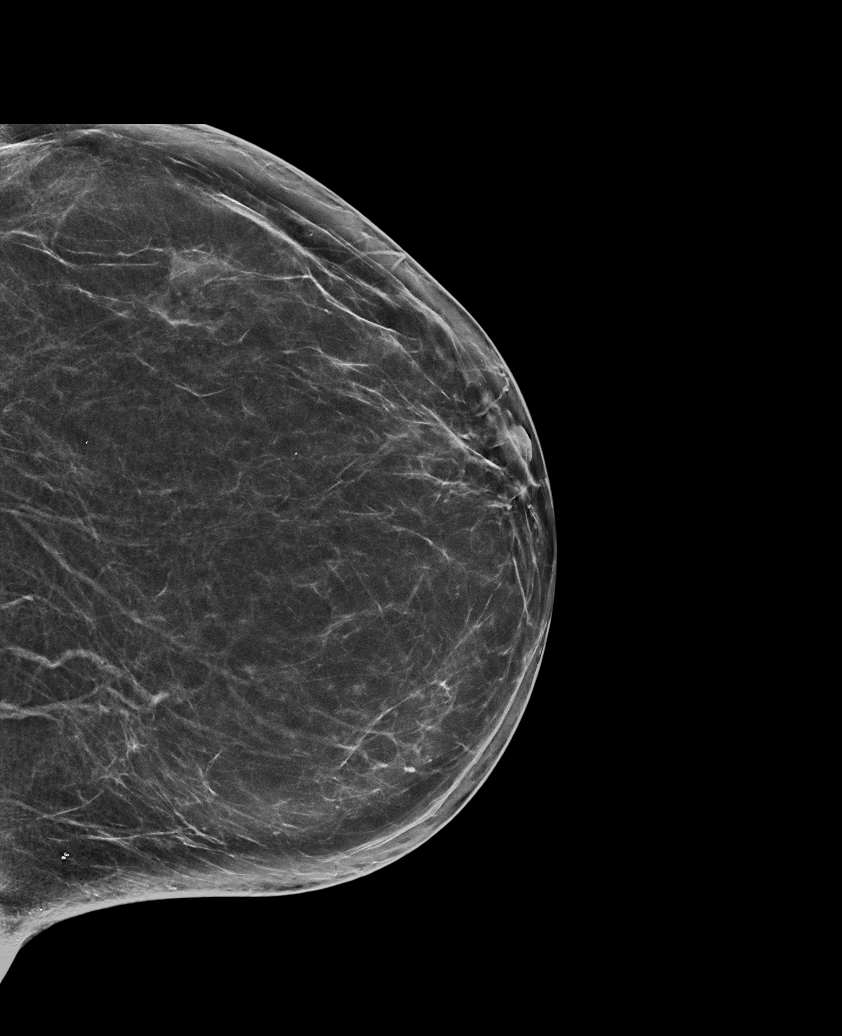

[R MLO synth-2D]
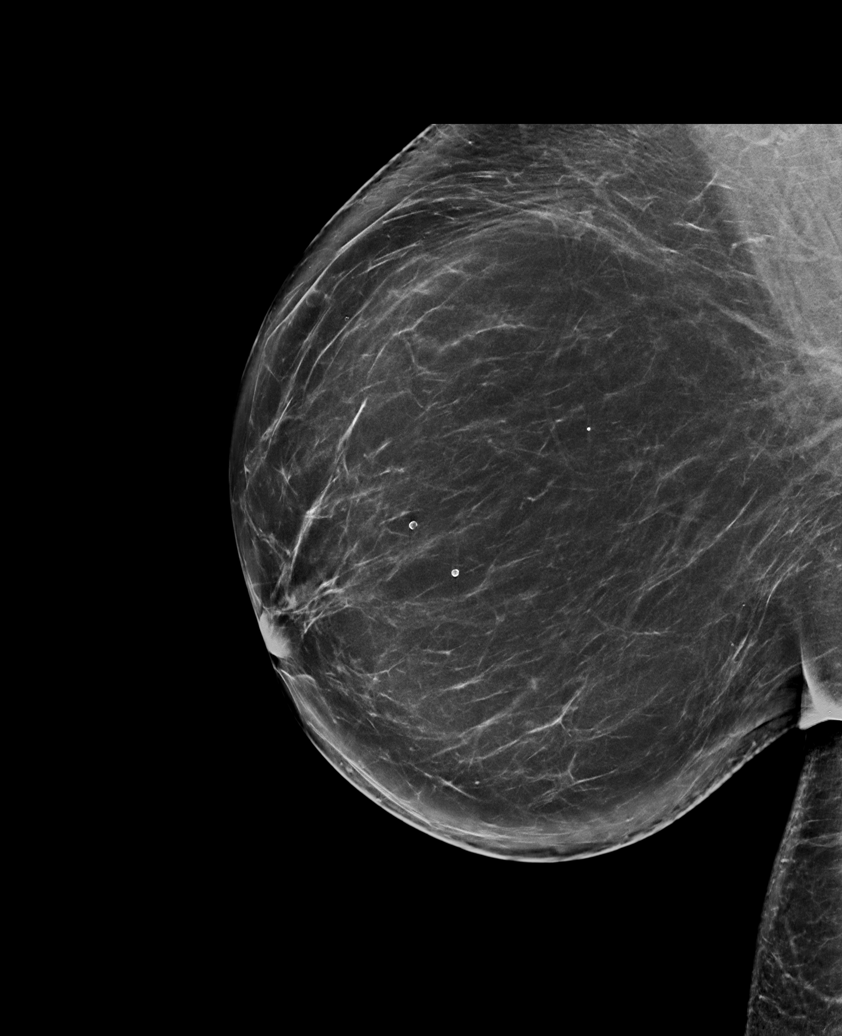

[L CC synth-2D (2 of 2)]
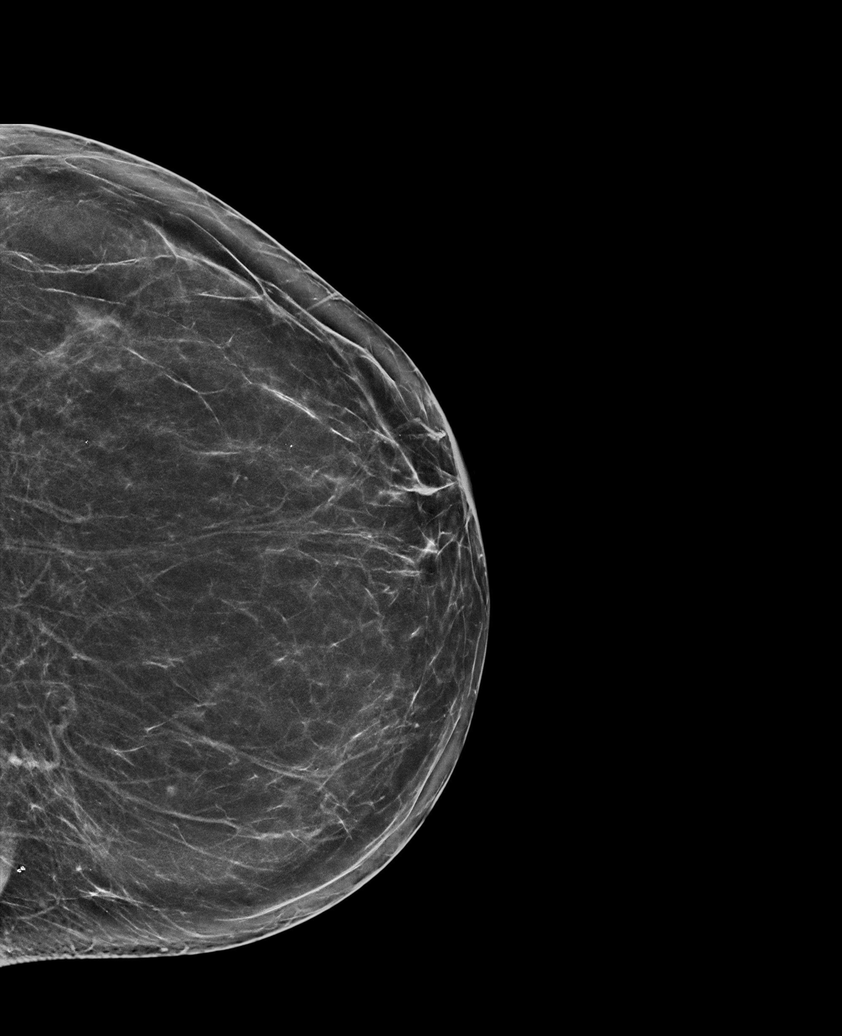

[L MLO tomo · tomo slice 43/86.0]
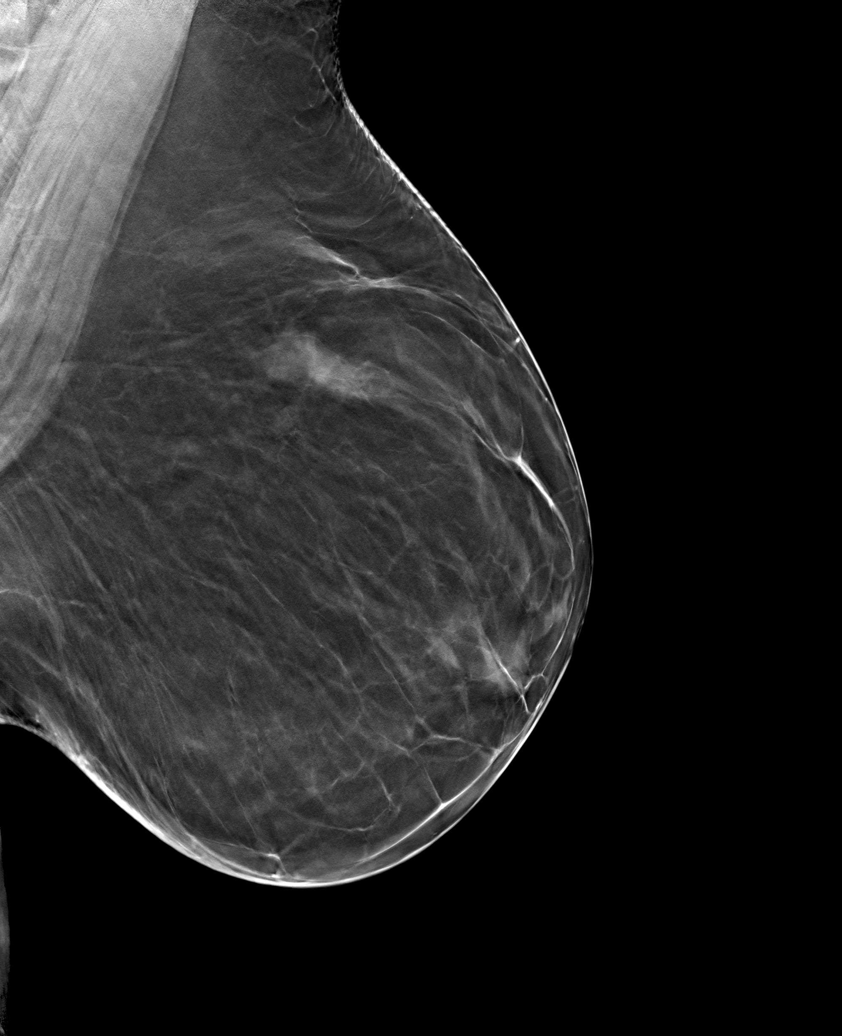

[6 of 30 positions shown; findings below may reference images not displayed]

FINDINGS: There are no findings suspicious for malignancy.
IMPRESSION: No mammographic evidence of malignancy. A result letter of this
screening mammogram will be mailed directly to the patient.

RECOMMENDATION:
Screening mammogram in one year. (Code:0E-3-N98)

BI-RADS CATEGORY  1: Negative.
